# Patient Record
Sex: Male | Born: 1980 | Race: White | Hispanic: No | Marital: Married | Smoking: Current every day smoker
Health system: Southern US, Community
[De-identification: ages and names within clinical notes are randomized; demographics above are authoritative.]

## PROBLEM LIST (undated history)

## (undated) DIAGNOSIS — M1990 Unspecified osteoarthritis, unspecified site: Secondary | ICD-10-CM

## (undated) DIAGNOSIS — F419 Anxiety disorder, unspecified: Secondary | ICD-10-CM

## (undated) DIAGNOSIS — F4310 Post-traumatic stress disorder, unspecified: Secondary | ICD-10-CM

## (undated) DIAGNOSIS — K219 Gastro-esophageal reflux disease without esophagitis: Secondary | ICD-10-CM

## (undated) DIAGNOSIS — R569 Unspecified convulsions: Secondary | ICD-10-CM

## (undated) DIAGNOSIS — I10 Essential (primary) hypertension: Secondary | ICD-10-CM

## (undated) HISTORY — PX: KNEE SURGERY: SHX244

---
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
## 2001-02-08 ENCOUNTER — Emergency Department (HOSPITAL_COMMUNITY): Admission: EM | Admit: 2001-02-08 | Discharge: 2001-02-08 | Payer: Self-pay | Admitting: *Deleted

## 2010-08-17 ENCOUNTER — Emergency Department (HOSPITAL_COMMUNITY)
Admission: EM | Admit: 2010-08-17 | Discharge: 2010-08-17 | Disposition: A | Discharge status: Home / Self Care | Payer: Self-pay | Attending: Emergency Medicine | Admitting: Emergency Medicine

## 2010-08-17 DIAGNOSIS — I10 Essential (primary) hypertension: Secondary | ICD-10-CM | POA: Insufficient documentation

## 2010-08-17 DIAGNOSIS — L723 Sebaceous cyst: Secondary | ICD-10-CM | POA: Insufficient documentation

## 2010-08-17 DIAGNOSIS — F411 Generalized anxiety disorder: Secondary | ICD-10-CM | POA: Insufficient documentation

## 2010-08-20 LAB — WOUND CULTURE
Culture: NO GROWTH
Gram Stain: NONE SEEN

## 2011-06-12 ENCOUNTER — Other Ambulatory Visit: Payer: Self-pay | Admitting: Nephrology

## 2011-06-12 ENCOUNTER — Ambulatory Visit
Admission: RE | Admit: 2011-06-12 | Discharge: 2011-06-12 | Disposition: A | Discharge status: Home / Self Care | Payer: Medicaid Other | Source: Ambulatory Visit | Attending: Nephrology | Admitting: Nephrology

## 2011-06-12 DIAGNOSIS — R52 Pain, unspecified: Secondary | ICD-10-CM

## 2011-06-12 DIAGNOSIS — F17200 Nicotine dependence, unspecified, uncomplicated: Secondary | ICD-10-CM

## 2011-07-08 ENCOUNTER — Encounter (HOSPITAL_COMMUNITY): Payer: Self-pay

## 2011-07-08 ENCOUNTER — Emergency Department (HOSPITAL_COMMUNITY)
Admission: EM | Admit: 2011-07-08 | Discharge: 2011-07-08 | Disposition: A | Discharge status: Home / Self Care | Payer: Medicaid Other | Attending: Emergency Medicine | Admitting: Emergency Medicine

## 2011-07-08 ENCOUNTER — Emergency Department (HOSPITAL_COMMUNITY): Payer: Medicaid Other

## 2011-07-08 ENCOUNTER — Other Ambulatory Visit: Payer: Self-pay

## 2011-07-08 DIAGNOSIS — R569 Unspecified convulsions: Secondary | ICD-10-CM

## 2011-07-08 DIAGNOSIS — F411 Generalized anxiety disorder: Secondary | ICD-10-CM | POA: Insufficient documentation

## 2011-07-08 DIAGNOSIS — Z79899 Other long term (current) drug therapy: Secondary | ICD-10-CM | POA: Insufficient documentation

## 2011-07-08 DIAGNOSIS — R339 Retention of urine, unspecified: Secondary | ICD-10-CM | POA: Insufficient documentation

## 2011-07-08 DIAGNOSIS — R319 Hematuria, unspecified: Secondary | ICD-10-CM | POA: Insufficient documentation

## 2011-07-08 DIAGNOSIS — K219 Gastro-esophageal reflux disease without esophagitis: Secondary | ICD-10-CM | POA: Insufficient documentation

## 2011-07-08 DIAGNOSIS — F4310 Post-traumatic stress disorder, unspecified: Secondary | ICD-10-CM | POA: Insufficient documentation

## 2011-07-08 DIAGNOSIS — M129 Arthropathy, unspecified: Secondary | ICD-10-CM | POA: Insufficient documentation

## 2011-07-08 DIAGNOSIS — I10 Essential (primary) hypertension: Secondary | ICD-10-CM | POA: Insufficient documentation

## 2011-07-08 DIAGNOSIS — T50905A Adverse effect of unspecified drugs, medicaments and biological substances, initial encounter: Secondary | ICD-10-CM

## 2011-07-08 HISTORY — DX: Essential (primary) hypertension: I10

## 2011-07-08 HISTORY — DX: Unspecified osteoarthritis, unspecified site: M19.90

## 2011-07-08 HISTORY — DX: Unspecified convulsions: R56.9

## 2011-07-08 HISTORY — DX: Gastro-esophageal reflux disease without esophagitis: K21.9

## 2011-07-08 HISTORY — DX: Anxiety disorder, unspecified: F41.9

## 2011-07-08 HISTORY — DX: Post-traumatic stress disorder, unspecified: F43.10

## 2011-07-08 LAB — BASIC METABOLIC PANEL
BUN: 10 mg/dL (ref 6–23)
CO2: 35 mEq/L — ABNORMAL HIGH (ref 19–32)
Chloride: 96 mEq/L (ref 96–112)
Glucose, Bld: 137 mg/dL — ABNORMAL HIGH (ref 70–99)
Potassium: 4.1 mEq/L (ref 3.5–5.1)
Sodium: 137 mEq/L (ref 135–145)

## 2011-07-08 LAB — DIFFERENTIAL
Lymphocytes Relative: 10 % — ABNORMAL LOW (ref 12–46)
Lymphs Abs: 1.2 10*3/uL (ref 0.7–4.0)
Monocytes Relative: 9 % (ref 3–12)
Neutro Abs: 9.8 10*3/uL — ABNORMAL HIGH (ref 1.7–7.7)
Neutrophils Relative %: 80 % — ABNORMAL HIGH (ref 43–77)

## 2011-07-08 LAB — SALICYLATE LEVEL: Salicylate Lvl: <2 mg/dL — ABNORMAL LOW (ref 2.8–20.0)

## 2011-07-08 LAB — GLUCOSE, CAPILLARY: Glucose-Capillary: 115 mg/dL — ABNORMAL HIGH (ref 70–99)

## 2011-07-08 LAB — ETHANOL: Alcohol, Ethyl (B): <11 mg/dL (ref 0–11)

## 2011-07-08 LAB — CBC
Hemoglobin: 13.7 g/dL (ref 13.0–17.0)
MCH: 31.6 pg (ref 26.0–34.0)
Platelets: 196 10*3/uL (ref 150–400)
RBC: 4.34 MIL/uL (ref 4.22–5.81)
WBC: 12.3 10*3/uL — ABNORMAL HIGH (ref 4.0–10.5)

## 2011-07-08 MED ORDER — SODIUM CHLORIDE 0.9 % IV BOLUS (SEPSIS)
1000.0000 mL | Freq: Once | INTRAVENOUS | Status: AC
  Administered 2011-07-08: 1000 mL via INTRAVENOUS

## 2011-07-08 NOTE — ED Provider Notes (Signed)
History     CSN: [id number]  Arrival date & time 07/08/11  4098   First MD Initiated Contact with Patient 07/08/11 [phone]      Chief Complaint  Patient presents with  . Seizures  . Urinary Retention    (Consider location/radiation/quality/duration/timing/severity/associated sxs/prior treatment) Patient is a 31 y.o. male presenting with seizures. The history is provided by the patient, a relative and a parent.  Seizures  This is a new problem. The current episode started 1 to 2 hours ago. The problem has been resolved. There were 2 to 3 seizures. The most recent episode lasted 30 to 120 seconds. Associated symptoms include confusion and speech difficulty. Pertinent negatives include no headaches, no visual disturbance, no neck stiffness, no chest pain, no nausea, no vomiting, no diarrhea and no muscle weakness. Characteristics include rhythmic jerking and loss of consciousness. Characteristics do not include bladder incontinence or bit tongue. The episode was witnessed. The seizures did not continue in the ED. There has been no fever.  Per family pt was normal  Yesterday. Took is regular medications prior to bed time last night which was ambien, tylenol #3, seroquel. Pt then work up this morning very somnolent. Was leaning over the bath tub sedated and had 3 seizures, tonic clonic per significant other.  Pt denies any other drugs or alcohol. Admits to occasional fentanyl patches that he gets from his neigbor's, but denies using those in 2 days. Denies prior seizures. Per family pt is now appearing very sedated, slurring his speech.  Past Medical History  Diagnosis Date  . Seizures   . Hypertension   . Acid reflux   . Arthritis   . Anxiety   . PTSD (post-traumatic stress disorder)     No past surgical history on file.  No family history on file.  History  Substance Use Topics  . Smoking status: Not on file  . Smokeless tobacco: Not on file  . Alcohol Use:       Review of  Systems  Constitutional: Positive for chills.  HENT: Negative.   Eyes: Negative.  Negative for visual disturbance.  Respiratory: Negative.   Cardiovascular: Negative for chest pain, palpitations and leg swelling.  Gastrointestinal: Negative.  Negative for nausea, vomiting and diarrhea.  Genitourinary: Positive for hematuria and difficulty urinating. Negative for bladder incontinence.  Musculoskeletal: Negative.   Skin: Negative.   Neurological: Positive for seizures, loss of consciousness, speech difficulty and weakness. Negative for facial asymmetry and headaches.  Psychiatric/Behavioral: Positive for confusion and decreased concentration.    Allergies  Tramadol  Home Medications   Current Outpatient Rx  Name Route Sig Dispense Refill  . ACETAMINOPHEN-CODEINE #3 300-30 MG PO TABS Oral Take 1 tablet by mouth every 4 (four) hours as needed. pain    . DICLOFENAC SODIUM 1 % TD GEL Topical Apply 1 application topically 2 (two) times daily.    . FUROSEMIDE 40 MG PO TABS Oral Take 40 mg by mouth every other day.    . IBUPROFEN 200 MG PO TABS Oral Take 400 mg by mouth every 6 (six) hours as needed. pain    . QUETIAPINE FUMARATE 200 MG PO TABS Oral Take 200 mg by mouth at bedtime.    Marland Kitchen ZOLPIDEM TARTRATE 5 MG PO TABS Oral Take 5 mg by mouth at bedtime as needed. sleep      BP 122/73  Pulse 105  Temp(Src) 98.6 F (37 C) (Oral)  Resp 17  SpO2 96%  Physical Exam  Nursing note  and vitals reviewed. Constitutional: He is oriented to person, place, and time. He appears well-developed and well-nourished. No distress.       Appears somnlent, hard time keeping eyes open  HENT:  Head: Normocephalic and atraumatic.       Oral mucosa dry  Eyes: EOM are normal.       Pupils constricted, round, reactive  Neck: Normal range of motion. Neck supple.  Cardiovascular: Regular rhythm.  Tachycardia present.   Pulmonary/Chest: Effort normal and breath sounds normal. No respiratory distress.    Abdominal: Normal appearance and bowel sounds are normal. There is no tenderness.  Musculoskeletal: Normal range of motion. He exhibits no edema.  Neurological: He is alert and oriented to person, place, and time.       Somnolent, movements slow, normal coordination  Skin: Skin is warm and dry.  Psychiatric:       Flat affect    ED Course  Procedures (including critical care time)  9:17 AM Pt seen and examined by me. Pt is tachycardic, oral mucosa dry, pt appears sedated, pupils constricted. Worrisome for drug over dose vs anticholinergic toxicity. Labs and ECG ordered. Will do CT head for new onset seizure. Will monitor.     Date: 07/08/2011  Rate: 102  Rhythm: sinus tachycardia  QRS Axis: normal  Intervals: normal  ST/T Wave abnormalities: normal  Conduction Disutrbances:none  Narrative Interpretation:   Old EKG Reviewed: none available  Results for orders placed during the hospital encounter of 07/08/11  CBC      Component Value Range   WBC 12.3 (*) 4.0 - 10.5 (K/uL)   RBC 4.34  4.22 - 5.81 (MIL/uL)   Hemoglobin 13.7  13.0 - 17.0 (g/dL)   HCT 11.9  14.7 - 82.9 (%)   MCV 93.1  78.0 - 100.0 (fL)   MCH 31.6  26.0 - 34.0 (pg)   MCHC 33.9  30.0 - 36.0 (g/dL)   RDW 56.2  13.0 - 86.5 (%)   Platelets 196  150 - 400 (K/uL)  DIFFERENTIAL      Component Value Range   Neutrophils Relative 80 (*) 43 - 77 (%)   Neutro Abs 9.8 (*) 1.7 - 7.7 (K/uL)   Lymphocytes Relative 10 (*) 12 - 46 (%)   Lymphs Abs 1.2  0.7 - 4.0 (K/uL)   Monocytes Relative 9  3 - 12 (%)   Monocytes Absolute 1.1 (*) 0.1 - 1.0 (K/uL)   Eosinophils Relative 2  0 - 5 (%)   Eosinophils Absolute 0.2  0.0 - 0.7 (K/uL)   Basophils Relative 0  0 - 1 (%)   Basophils Absolute 0.0  0.0 - 0.1 (K/uL)  BASIC METABOLIC PANEL      Component Value Range   Sodium 137  135 - 145 (mEq/L)   Potassium 4.1  3.5 - 5.1 (mEq/L)   Chloride 96  96 - 112 (mEq/L)   CO2 35 (*) 19 - 32 (mEq/L)   Glucose, Bld 137 (*) 70 - 99 (mg/dL)    BUN 10  6 - 23 (mg/dL)   Creatinine, Ser 7.84  0.50 - 1.35 (mg/dL)   Calcium 9.5  8.4 - 69.6 (mg/dL)   GFR calc non Af Amer >90  >90 (mL/min)   GFR calc Af Amer >90  >90 (mL/min)  ACETAMINOPHEN LEVEL      Component Value Range   Acetaminophen (Tylenol), Serum <15.0  10 - 30 (ug/mL)  SALICYLATE LEVEL      Component Value Range  Salicylate Lvl <2.0 (*) 2.8 - 20.0 (mg/dL)  ETHANOL      Component Value Range   Alcohol, Ethyl (B) <11  0 - 11 (mg/dL)  GLUCOSE, CAPILLARY      Component Value Range   Glucose-Capillary 115 (*) 70 - 99 (mg/dL)   Comment 1 Documented in Chart     Comment 2 Notify RN     Dg Chest 2 Vie Ct Head Wo Contrast  07/08/2011  *RADIOLOGY REPORT*  Clinical Data: Seizure  CT HEAD WITHOUT CONTRAST  Technique:  Contiguous axial images were obtained from the base of the skull through the vertex without contrast.  Comparison: None.  Findings: Ventricles are normal in size.  Negative for intracranial hemorrhage.  Negative for mass or edema.  Negative for acute or chronic infarct.  Chronic sinusitis with mucosal edema in the paranasal sinuses.  No acute bony abnormality.  IMPRESSION: No significant intracranial abnormality.  Chronic sinusitis.  Original Report Authenticated By: Camelia Phenes, M.D.      1:18 PM Pt monitored while in ED. No arrhithmias on ECG. Negative CT heat. No significant lab abnormalities. Pt AAOx3, continues to be sedated. Pt refusing to provide urine. Wife asking for pt to be discharged with him. Will d/c home. VS normal on the monitor. Instructed to follow up closely. No diagnosis found.    MDM          Lottie Mussel, PA 07/08/11 1320

## 2011-07-08 NOTE — ED Provider Notes (Signed)
Medical screening examination/treatment/procedure(s) were performed by non-physician practitioner and as supervising physician I was immediately available for consultation/collaboration.  Javelle Donigan R Konner Saiz, MD 07/08/11 1959 

## 2011-07-08 NOTE — ED Notes (Signed)
Pt in via ems with seizure activity and painful urination  States headache and neck pain family states pt had 3 seizures in less than 5 minutes pt a/o x3 on arrival per ems non postictal state on arrival

## 2011-07-08 NOTE — ED Notes (Signed)
Pt is resting with family at bedside.  Pt still cannot provide urine for specimen.  Pt made aware that EDP ordered I&O, pt refuses.  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

## 2011-07-08 NOTE — ED Notes (Signed)
Tatyana K. EDPA  Notified re pt's refusal to have I&O cath done and his desire to go home.

## 2011-07-08 NOTE — ED Notes (Signed)
ZOX:[id number]<AV> Expected date:07/08/11<BR> Expected time: 8:02 AM<BR> Means of arrival:Ambulance<BR> Comments:<BR> seizure

## 2011-08-22 ENCOUNTER — Other Ambulatory Visit: Payer: Self-pay

## 2011-08-22 ENCOUNTER — Observation Stay (HOSPITAL_COMMUNITY)
Admission: EM | Admit: 2011-08-22 | Discharge: 2011-08-22 | Discharge status: Against Medical Advice | Payer: Medicaid Other | Attending: Internal Medicine | Admitting: Internal Medicine

## 2011-08-22 ENCOUNTER — Encounter (HOSPITAL_COMMUNITY): Payer: Self-pay | Admitting: Emergency Medicine

## 2011-08-22 ENCOUNTER — Ambulatory Visit (HOSPITAL_COMMUNITY): Payer: Medicaid Other

## 2011-08-22 DIAGNOSIS — R259 Unspecified abnormal involuntary movements: Secondary | ICD-10-CM | POA: Insufficient documentation

## 2011-08-22 DIAGNOSIS — E876 Hypokalemia: Secondary | ICD-10-CM | POA: Diagnosis present

## 2011-08-22 DIAGNOSIS — F410 Panic disorder [episodic paroxysmal anxiety] without agoraphobia: Secondary | ICD-10-CM | POA: Insufficient documentation

## 2011-08-22 DIAGNOSIS — F4310 Post-traumatic stress disorder, unspecified: Secondary | ICD-10-CM | POA: Insufficient documentation

## 2011-08-22 DIAGNOSIS — I10 Essential (primary) hypertension: Secondary | ICD-10-CM | POA: Insufficient documentation

## 2011-08-22 DIAGNOSIS — G9340 Encephalopathy, unspecified: Secondary | ICD-10-CM | POA: Diagnosis present

## 2011-08-22 DIAGNOSIS — K219 Gastro-esophageal reflux disease without esophagitis: Secondary | ICD-10-CM | POA: Diagnosis present

## 2011-08-22 DIAGNOSIS — R4182 Altered mental status, unspecified: Principal | ICD-10-CM | POA: Insufficient documentation

## 2011-08-22 DIAGNOSIS — F17200 Nicotine dependence, unspecified, uncomplicated: Secondary | ICD-10-CM | POA: Insufficient documentation

## 2011-08-22 DIAGNOSIS — R569 Unspecified convulsions: Secondary | ICD-10-CM | POA: Diagnosis present

## 2011-08-22 DIAGNOSIS — F411 Generalized anxiety disorder: Secondary | ICD-10-CM | POA: Insufficient documentation

## 2011-08-22 LAB — CBC
HCT: 38.6 % — ABNORMAL LOW (ref 39.0–52.0)
Hemoglobin: 15.3 g/dL (ref 13.0–17.0)
Hemoglobin: 13.4 g/dL (ref 13.0–17.0)
MCH: 31.7 pg (ref 26.0–34.0)
MCV: 89.8 fL (ref 78.0–100.0)
MCV: 90.4 fL (ref 78.0–100.0)
Platelets: 217 10*3/uL (ref 150–400)
RBC: 4.82 MIL/uL (ref 4.22–5.81)
RDW: 12.8 % (ref 11.5–15.5)
WBC: 7.8 10*3/uL (ref 4.0–10.5)
WBC: 10.8 10*3/uL — ABNORMAL HIGH (ref 4.0–10.5)

## 2011-08-22 LAB — COMPREHENSIVE METABOLIC PANEL
ALT: 18 U/L (ref 0–53)
ALT: 15 U/L (ref 0–53)
Albumin: 3.5 g/dL (ref 3.5–5.2)
Alkaline Phosphatase: 70 U/L (ref 39–117)
Alkaline Phosphatase: 65 U/L (ref 39–117)
BUN: 15 mg/dL (ref 6–23)
BUN: 17 mg/dL (ref 6–23)
CO2: 22 mEq/L (ref 19–32)
Calcium: 9.3 mg/dL (ref 8.4–10.5)
Calcium: 8.7 mg/dL (ref 8.4–10.5)
GFR calc Af Amer: >90 mL/min (ref >90)
GFR calc non Af Amer: >90 mL/min (ref >90)
Glucose, Bld: 105 mg/dL — ABNORMAL HIGH (ref 70–99)
Potassium: 2.9 mEq/L — ABNORMAL LOW (ref 3.5–5.1)
Potassium: 3.6 mEq/L (ref 3.5–5.1)
Sodium: 140 mEq/L (ref 135–145)
Sodium: 140 mEq/L (ref 135–145)
Total Protein: 6.6 g/dL (ref 6.0–8.3)
Total Protein: 6 g/dL (ref 6.0–8.3)

## 2011-08-22 LAB — DIFFERENTIAL
Eosinophils Absolute: 0.1 10*3/uL (ref 0.0–0.7)
Eosinophils Relative: 2 % (ref 0–5)
Lymphocytes Relative: 41 % (ref 12–46)
Lymphs Abs: 3.2 10*3/uL (ref 0.7–4.0)
Monocytes Relative: 6 % (ref 3–12)

## 2011-08-22 LAB — APTT: aPTT: 27 seconds (ref 24–37)

## 2011-08-22 LAB — ACETAMINOPHEN LEVEL: Acetaminophen (Tylenol), Serum: <15 ug/mL (ref 10–30)

## 2011-08-22 LAB — CK: Total CK: 50 U/L (ref 7–232)

## 2011-08-22 MED ORDER — POTASSIUM CHLORIDE IN NACL 40-0.9 MEQ/L-% IV SOLN
INTRAVENOUS | Status: DC
  Administered 2011-08-22: 05:00:00 via INTRAVENOUS
  Filled 2011-08-22 (×3): qty 1000

## 2011-08-22 MED ORDER — LORAZEPAM 2 MG/ML IJ SOLN
INTRAMUSCULAR | Status: AC
  Filled 2011-08-22: qty 1

## 2011-08-22 MED ORDER — ONDANSETRON HCL 4 MG PO TABS: 4.0000 mg | ORAL_TABLET | Freq: Four times a day (QID) | ORAL | Status: DC | PRN

## 2011-08-22 MED ORDER — LORAZEPAM 2 MG/ML IJ SOLN
2.0000 mg | Freq: Once | INTRAMUSCULAR | Status: AC
  Administered 2011-08-22: 2 mg via INTRAVENOUS
  Filled 2011-08-22: qty 1

## 2011-08-22 MED ORDER — LORAZEPAM 2 MG/ML IJ SOLN
2.0000 mg | Freq: Once | INTRAMUSCULAR | Status: AC
  Administered 2011-08-22: 2 mg via INTRAVENOUS

## 2011-08-22 MED ORDER — ONDANSETRON HCL 4 MG/2ML IJ SOLN: 4.0000 mg | Freq: Four times a day (QID) | INTRAMUSCULAR | Status: DC | PRN

## 2011-08-22 MED ORDER — SODIUM CHLORIDE 0.9 % IJ SOLN: 3.0000 mL | Freq: Two times a day (BID) | INTRAMUSCULAR | Status: DC

## 2011-08-22 MED ORDER — LORAZEPAM 2 MG/ML IJ SOLN: 2.0000 mg | Freq: Once | INTRAMUSCULAR | Status: DC

## 2011-08-22 NOTE — ED Notes (Signed)
Patient with tremors and possible seizure like activity tonight, EMS found patient with tremors and CAOx3, able to walk and talk, no urinary incontinence.  Patient's family states today is anniversary for death of friend.  Patient has history of PTSD.

## 2011-08-22 NOTE — Progress Notes (Signed)
Pt wanted to leave AMA. MD aware. Pt signed AMA form. IV and tele Dc'd. Pt and belongings escorted to exit of facility. [place], Rosanna Randy

## 2011-08-22 NOTE — Progress Notes (Signed)
Pt arrived to the floor via stretcher, accompanied by Nursing staff. Admission hx and assessment completed. Pt had no complaints of pain or shortness of breath. Will continue to assess. Bed in lowest position, wheels locked, and call bell within reach. 

## 2011-08-22 NOTE — ED Notes (Signed)
Patient here with garbled speech, red skin, dry mucus membranes.  Patient with tremors, has had before.  Patient's family states that he has "seizures", but has not been diagnosed or take meds for seizures.  Patient denies any drug use.

## 2011-08-22 NOTE — Discharge Summary (Signed)
Patient ID: Arthur Baird MRN: [id number] DOB/AGE: [birth year] 31 y.o.  Admit date: 08/22/2011 Discharge date: 08/22/2011  Primary Care Physician:  Jarome Matin, MD, MD  Discharge Diagnoses:     Principal Problem:  *Panic anxiety syndrome Active Problems:  Encephalopathy  Hypokalemia  GERD (gastroesophageal reflux disease)  Seizure   Medication List  As of 08/22/2011  5:53 PM   ASK your doctor about these medications         acetaminophen-codeine 300-30 MG per tablet   Commonly known as: TYLENOL #3   Take 1 tablet by mouth every 4 (four) hours as needed. pain      carisoprodol 350 MG tablet   Commonly known as: SOMA   Take 350 mg by mouth 4 (four) times daily as needed. For spasms      diclofenac sodium 1 % Gel   Commonly known as: VOLTAREN   Apply 1 application topically 2 (two) times daily.      furosemide 40 MG tablet   Commonly known as: LASIX   Take 40 mg by mouth every other day.      ibuprofen 200 MG tablet   Commonly known as: ADVIL,MOTRIN   Take 400 mg by mouth every 6 (six) hours as needed. pain      QUEtiapine 200 MG tablet   Commonly known as: SEROQUEL   Take 200 mg by mouth at bedtime.      zolpidem 5 MG tablet   Commonly known as: AMBIEN   Take 5 mg by mouth at bedtime as needed. sleep            Disposition and Follow-up FOLLOW UP WITH PCP  Consults:   NONE  Significant Diagnostic Studies:  No results found.  Brief H and P: For complete details please refer to admission H and P, but in brief 31 yo man brought in by family after found unresponsive at home. He had a similar episode before presumed due to seizure but never had a work up. He was said to have had another seizure by family but never observed. Patient has history of PTSD and on seroquel among other things. He left the ER AMA last time refusing to give urine for tox screen.   Physical Exam on Discharge:  Filed Vitals:   08/22/11 0401 08/22/11 0435 08/22/11 0450  08/22/11 0844  BP: 111/64  112/71 112/66  Pulse: 100  105 92  Temp:   98 F (36.7 C) 98.2 F (36.8 C)  TempSrc:   Oral Oral  Resp: 15  20 17   Height:  5\' 11"  (1.803 m)    Weight:   61.8 kg (136 lb 3.9 oz)   SpO2: 98%  99% 99%     Intake/Output Summary (Last 24 hours) at 08/22/11 1753 Last data filed at 08/22/11 0846  Gross per 24 hour  Intake   2200 ml  Output      0 ml  Net   2200 ml    General: Alert, awake, oriented x3, in no acute distress. HEENT: No bruits, no goiter. Heart: Regular rate and rhythm, without murmurs, rubs, gallops. Lungs: Clear to auscultation bilaterally. Abdomen: Soft, nontender, nondistended, positive bowel sounds. Extremities: No clubbing cyanosis or edema with positive pedal pulses. Neuro: Grossly intact, nonfocal.  CBC:    Component Value Date/Time   WBC 10.8* 08/22/2011 0525   HGB 13.4 08/22/2011 0525   HCT 38.6* 08/22/2011 0525   PLT 215 08/22/2011 0525   MCV 90.4 08/22/2011 0525  NEUTROABS 3.9 08/22/2011 0043   LYMPHSABS 3.2 08/22/2011 0043   MONOABS 0.5 08/22/2011 0043   EOSABS 0.1 08/22/2011 0043   BASOSABS 0.0 08/22/2011 0043    Basic Metabolic Panel:    Component Value Date/Time   NA 140 08/22/2011 0525   K 3.6 08/22/2011 0525   CL 108 08/22/2011 0525   CO2 24 08/22/2011 0525   BUN 17 08/22/2011 0525   CREATININE 0.89 08/22/2011 0525   GLUCOSE 77 08/22/2011 0525   CALCIUM 8.7 08/22/2011 0525    Hospital Course:  Patient admitted with concern for AMS and ? Seizures. He was incoherent, sleepy and anxious on  admission. During my evaluation this am he was awake and fully oriented and says he was sleepy due to soma that he took yesterday. He says he remembers everything that happended but did not resists coming to hospital as he was too sleepy. Spoke with his wife at length who tells ne that he is combative at times, very suspicious and panics with small issues. On question patient says he has issues with panicking with stressors in life and that's likely what  happened yesterday. He has issues with poor urine output since past 2 yrs and is seeing a urologist. i requested him to stay back, get a utox and further work up including a psych eval but he was quite agitated and wanted to leave right away. patient clearly oriented and capable to make decisions for himself and signed himself out. He was informed the health risks associated with signing out and he understood very clearly.   Time spent on Discharge: 45 mins  Signed: Eddie North 08/22/2011, 5:53 PM

## 2011-08-22 NOTE — ED Provider Notes (Signed)
History     CSN: [id number]  Arrival date & time 08/22/11  0019   First MD Initiated Contact with Patient 08/22/11 0030      Chief Complaint  Patient presents with  . Tremors  . Seizures    (Consider location/radiation/quality/duration/timing/severity/associated sxs/prior treatment) HPI Comments: 31 year old male with a history of hypertension, anxiety, posttraumatic stress disorder, possible seizures who presents with a complaint of altered mental status. According to the paramedics the patient was found walking around his house, slurring his speech, severe tremor, unsteady gait and confused. Review of the medical records shows that the patient had a similar presentation in the last 2 months he was found to be altered, tachycardic, seizure-like activity and slurred speech. At that time his CT head was normal, blood work was nonfocal and patient finally requested to be discharged. The patient is unable to tell me when the symptoms started, what he was doing when it started or what the nature of his symptoms initially were. Family is not here at this time  The history is provided by the patient, the EMS personnel and medical records. The history is limited by the condition of the patient (Altered mental status).    Past Medical History  Diagnosis Date  . Seizures   . Hypertension   . Acid reflux   . Arthritis   . Anxiety   . PTSD (post-traumatic stress disorder)     History reviewed. No pertinent past surgical history.  History reviewed. No pertinent family history.  History  Substance Use Topics  . Smoking status: Not on file  . Smokeless tobacco: Not on file  . Alcohol Use:       Review of Systems  Unable to perform ROS: Mental status change    Allergies  Tramadol  Home Medications   Current Outpatient Rx  Name Route Sig Dispense Refill  . ACETAMINOPHEN-CODEINE #3 300-30 MG PO TABS Oral Take 1 tablet by mouth every 4 (four) hours as needed. pain    .  CARISOPRODOL 350 MG PO TABS Oral Take 350 mg by mouth 4 (four) times daily as needed. For spasms    . DICLOFENAC SODIUM 1 % TD GEL Topical Apply 1 application topically 2 (two) times daily.    . FUROSEMIDE 40 MG PO TABS Oral Take 40 mg by mouth every other day.    . IBUPROFEN 200 MG PO TABS Oral Take 400 mg by mouth every 6 (six) hours as needed. pain    . QUETIAPINE FUMARATE 200 MG PO TABS Oral Take 200 mg by mouth at bedtime.    Marland Kitchen ZOLPIDEM TARTRATE 5 MG PO TABS Oral Take 5 mg by mouth at bedtime as needed. sleep      BP 92/59  Pulse 116  Temp(Src) 98.3 F (36.8 C) (Rectal)  Resp 21  SpO2 95%  Physical Exam  Nursing note and vitals reviewed. Constitutional: He appears well-developed and well-nourished.       Tremulous, mildly somnolent, anxious appearing  HENT:  Head: Normocephalic and atraumatic.  Mouth/Throat: No oropharyngeal exudate.       Mucous membranes dry  Eyes: Conjunctivae and EOM are normal. Pupils are equal, round, and reactive to light. Right eye exhibits no discharge. Left eye exhibits no discharge. No scleral icterus.  Neck: Normal range of motion. Neck supple. No JVD present. No thyromegaly present.  Cardiovascular: Regular rhythm, normal heart sounds and intact distal pulses.  Exam reveals no gallop and no friction rub.   No murmur heard.  Tachycardia to 140  Pulmonary/Chest: Effort normal and breath sounds normal. No respiratory distress. He has no wheezes. He has no rales.  Abdominal: Soft. Bowel sounds are normal. He exhibits no distension and no mass. There is no tenderness.  Musculoskeletal: Normal range of motion. He exhibits no edema and no tenderness.  Lymphadenopathy:    He has no cervical adenopathy.  Neurological:       Diffuse tremor of both arms and both legs, strength 5 out of 5 in all 4 extremities, speech is slurred intermittently, stuttering speech, some memory loss 2 today  Skin: Skin is warm and dry. No rash noted. No erythema.    Psychiatric: He has a normal mood and affect. His behavior is normal.    ED Course  Procedures (including critical care time)  ED ECG REPORT   Date: 08/22/2011   Rate: 135  Rhythm: sinus tachycardia  QRS Axis: normal  Intervals: normal  ST/T Wave abnormalities: normal  Conduction Disutrbances:none  Narrative Interpretation:   Old EKG Reviewed: unchanged other than rate from last tracing   Labs Reviewed  COMPREHENSIVE METABOLIC PANEL - Abnormal; Notable for the following:    Potassium 2.9 (*)    Glucose, Bld 105 (*)    All other components within normal limits  LACTIC ACID, PLASMA - Abnormal; Notable for the following:    Lactic Acid, Venous 4.4 (*)    All other components within normal limits  ETHANOL - Abnormal; Notable for the following:    Alcohol, Ethyl (B) 17 (*)    All other components within normal limits  SALICYLATE LEVEL - Abnormal; Notable for the following:    Salicylate Lvl <2.0 (*)    All other components within normal limits  CBC  DIFFERENTIAL  APTT  PROTIME-INR  ACETAMINOPHEN LEVEL  CK  URINALYSIS, ROUTINE W REFLEX MICROSCOPIC  URINE RAPID DRUG SCREEN (HOSP PERFORMED)   No results found.   1. Altered mental status       MDM  Patient has an anticholinergic type presentation, he is known to be on Seroquel, Ambien, Lasix, soma and Tylenol 3. He also admits to prior visits to taking adverse medication including fentanyl patch. At this time we'll proceed with Ativan, fluids, supportive care, further evaluation. There is no indication to repeat CAT scan of the head at this time as he had a recent CAT scan that was normal. Blood work, urinalysis, drug screen, cardiac monitoring.  Laboratory results reviewed showing hypokalemia at 2.9, lactic acid of 4.4, CK of 50, CBC is normal, tachycardia has improved but speech remains slightly slurred. Patient states that he cannot give [place] a urine sample, he pees inconsistently but often only twice today.  2:30 AM,  care discussed with the [facility] hospitalist Dr. Mikeal Hawthorne who will admit the patient to the hospital.      Vida Roller, MD 08/22/11 [phone]

## 2011-08-22 NOTE — ED Notes (Signed)
Patient found taking out IV, taking EKG leads off, patient wanting to leave from ED.  Patient was talked down and placed back on monitor.  VS taken.

## 2011-08-22 NOTE — H&P (Signed)
Arthur Baird is an 31 y.o. male.   Chief Complaint: Altered mental status HPI: 31 yo man brought in by family after found unresponsive at home. He had a similar episode before presumed due to seizure but never had a work up. He was said to have had another seizure by family but never observed. Patient has history of PTSD and on seroquel among other things. He left the ER AMA last time refusing to give urine for tox screen. He is refusing to give urine again.  Past Medical History  Diagnosis Date  . Seizures   . Hypertension   . Acid reflux   . Arthritis   . Anxiety   . PTSD (post-traumatic stress disorder)     History reviewed. No pertinent past surgical history.  History reviewed. No pertinent family history. Social History:  reports that he has been smoking Cigarettes.  He has a 32 pack-year smoking history. He quit smokeless tobacco use about 8 weeks ago. His smokeless tobacco use included Chew. He reports that he drinks about 1.2 ounces of alcohol per week. He reports that he does not use illicit drugs.  Allergies:  Allergies  Allergen Reactions  . Tramadol Other (See Comments)    Cant pee    Medications Prior to Admission  Medication Dose Route Frequency Provider Last Rate Last Dose  . 0.9 % NaCl with KCl 40 mEq / L  infusion   Intravenous Continuous Lonia Blood, MD 100 mL/hr at 08/22/11 0520    . LORazepam (ATIVAN) injection 2 mg  2 mg Intravenous Once Vida Roller, MD   2 mg at 08/22/11 0042  . LORazepam (ATIVAN) injection 2 mg  2 mg Intravenous Once Vida Roller, MD   2 mg at 08/22/11 0334  . ondansetron (ZOFRAN) tablet 4 mg  4 mg Oral Q6H PRN Lonia Blood, MD       Or  . ondansetron (ZOFRAN) injection 4 mg  4 mg Intravenous Q6H PRN Lonia Blood, MD      . sodium chloride 0.9 % injection 3 mL  3 mL Intravenous Q12H Lonia Blood, MD      . DISCONTD: LORazepam (ATIVAN) injection 2 mg  2 mg Intravenous Once Vida Roller, MD       Medications Prior to Admission    Medication Sig Dispense Refill  . acetaminophen-codeine (TYLENOL #3) 300-30 MG per tablet Take 1 tablet by mouth every 4 (four) hours as needed. pain      . diclofenac sodium (VOLTAREN) 1 % GEL Apply 1 application topically 2 (two) times daily.      . furosemide (LASIX) 40 MG tablet Take 40 mg by mouth every other day.      . ibuprofen (ADVIL,MOTRIN) 200 MG tablet Take 400 mg by mouth every 6 (six) hours as needed. pain      . QUEtiapine (SEROQUEL) 200 MG tablet Take 200 mg by mouth at bedtime.      Marland Kitchen zolpidem (AMBIEN) 5 MG tablet Take 5 mg by mouth at bedtime as needed. sleep        Results for orders placed during the hospital encounter of 08/22/11 (from the past 48 hour(s))  CBC     Status: Normal   Collection Time   08/22/11 12:43 AM      Component Value Range Comment   WBC 7.8  4.0 - 10.5 (K/uL)    RBC 4.82  4.22 - 5.81 (MIL/uL)    Hemoglobin 15.3  13.0 - 17.0 (g/dL)  HCT 43.3  39.0 - 52.0 (%)    MCV 89.8  78.0 - 100.0 (fL)    MCH 31.7  26.0 - 34.0 (pg)    MCHC 35.3  30.0 - 36.0 (g/dL)    RDW 16.1  09.6 - 04.5 (%)    Platelets 217  150 - 400 (K/uL)   DIFFERENTIAL     Status: Normal   Collection Time   08/22/11 12:43 AM      Component Value Range Comment   Neutrophils Relative 50  43 - 77 (%)    Neutro Abs 3.9  1.7 - 7.7 (K/uL)    Lymphocytes Relative 41  12 - 46 (%)    Lymphs Abs 3.2  0.7 - 4.0 (K/uL)    Monocytes Relative 6  3 - 12 (%)    Monocytes Absolute 0.5  0.1 - 1.0 (K/uL)    Eosinophils Relative 2  0 - 5 (%)    Eosinophils Absolute 0.1  0.0 - 0.7 (K/uL)    Basophils Relative 1  0 - 1 (%)    Basophils Absolute 0.0  0.0 - 0.1 (K/uL)   COMPREHENSIVE METABOLIC PANEL     Status: Abnormal   Collection Time   08/22/11 12:43 AM      Component Value Range Comment   Sodium 140  135 - 145 (mEq/L)    Potassium 2.9 (*) 3.5 - 5.1 (mEq/L)    Chloride 102  96 - 112 (mEq/L)    CO2 22  19 - 32 (mEq/L)    Glucose, Bld 105 (*) 70 - 99 (mg/dL)    BUN 15  6 - 23 (mg/dL)     Creatinine, Ser 4.09  0.50 - 1.35 (mg/dL)    Calcium 9.3  8.4 - 10.5 (mg/dL)    Total Protein 6.6  6.0 - 8.3 (g/dL)    Albumin 3.9  3.5 - 5.2 (g/dL)    AST 15  0 - 37 (U/L)    ALT 18  0 - 53 (U/L)    Alkaline Phosphatase 70  39 - 117 (U/L)    Total Bilirubin 0.3  0.3 - 1.2 (mg/dL)    GFR calc non Af Amer >90  >90 (mL/min)    GFR calc Af Amer >90  >90 (mL/min)   APTT     Status: Normal   Collection Time   08/22/11 12:43 AM      Component Value Range Comment   aPTT 27  24 - 37 (seconds)   PROTIME-INR     Status: Normal   Collection Time   08/22/11 12:43 AM      Component Value Range Comment   Prothrombin Time 14.2  11.6 - 15.2 (seconds)    INR 1.08  0.00 - 1.49    ETHANOL     Status: Abnormal   Collection Time   08/22/11 12:43 AM      Component Value Range Comment   Alcohol, Ethyl (B) 17 (*) 0 - 11 (mg/dL)   SALICYLATE LEVEL     Status: Abnormal   Collection Time   08/22/11 12:43 AM      Component Value Range Comment   Salicylate Lvl <2.0 (*) 2.8 - 20.0 (mg/dL)   ACETAMINOPHEN LEVEL     Status: Normal   Collection Time   08/22/11 12:43 AM      Component Value Range Comment   Acetaminophen (Tylenol), Serum <15.0  10 - 30 (ug/mL)   CK     Status: Normal   Collection  Time   08/22/11 12:43 AM      Component Value Range Comment   Total CK 50  7 - 232 (U/L)   LACTIC ACID, PLASMA     Status: Abnormal   Collection Time   08/22/11 12:44 AM      Component Value Range Comment   Lactic Acid, Venous 4.4 (*) 0.5 - 2.2 (mmol/L)    No results found.  Review of Systems  Unable to perform ROS   Blood pressure 112/71, pulse 105, temperature 98 F (36.7 C), temperature source Oral, resp. rate 20, height 5\' 11"  (1.803 m), weight 61.8 kg (136 lb 3.9 oz), SpO2 99.00%. Physical Exam  Constitutional: He appears well-developed and well-nourished. He appears lethargic.  HENT:  Head: Normocephalic and atraumatic.  Right Ear: External ear normal.  Left Ear: External ear normal.  Nose: Nose normal.    Mouth/Throat: Oropharynx is clear and moist.  Eyes: Conjunctivae and EOM are normal. Pupils are equal, round, and reactive to light.  Neck: Normal range of motion. Neck supple.  Cardiovascular: Normal rate, regular rhythm, normal heart sounds and intact distal pulses.   Respiratory: Effort normal and breath sounds normal.  GI: Soft. Bowel sounds are normal.  Neurological: He appears lethargic.  Skin: Skin is warm and dry.  Psychiatric: His mood appears anxious.     Assessment/Plan 1. Encephalopathy: More than likely due to Drugs which could be prescription vs street drugs. His alcohol level is low so it is not due to intoxication most likely. Admit for observation, obtain urine drug screen, rule out seizure if possible, get EEG. Depending on results, plan next line of action. 2. GERD: PPI 3. Hypokalemia: replete his potassium   Jeromey Kruer,LAWAL 08/22/2011, 6:47 AM

## 2011-08-22 NOTE — Progress Notes (Signed)
Utilization review completed.  

## 2011-08-22 NOTE — ED Notes (Signed)
Pt given urinal, advised we needed urine for a sample, and advised staff he didn't need to void. Rechecked pt. He was resting, and advised again that we needed a UA sample. Wife at bedside stated " pt. Only voids twice a day, and that he has already voided twice a day and will not void again until morning."

## 2011-09-03 ENCOUNTER — Encounter (HOSPITAL_COMMUNITY): Payer: Self-pay | Admitting: *Deleted

## 2011-09-03 ENCOUNTER — Emergency Department (HOSPITAL_COMMUNITY)
Admission: EM | Admit: 2011-09-03 | Discharge: 2011-09-03 | Disposition: A | Discharge status: Home / Self Care | Payer: Medicaid Other | Attending: Emergency Medicine | Admitting: Emergency Medicine

## 2011-09-03 DIAGNOSIS — M129 Arthropathy, unspecified: Secondary | ICD-10-CM | POA: Insufficient documentation

## 2011-09-03 DIAGNOSIS — R339 Retention of urine, unspecified: Secondary | ICD-10-CM | POA: Insufficient documentation

## 2011-09-03 DIAGNOSIS — I10 Essential (primary) hypertension: Secondary | ICD-10-CM | POA: Insufficient documentation

## 2011-09-03 DIAGNOSIS — F411 Generalized anxiety disorder: Secondary | ICD-10-CM | POA: Insufficient documentation

## 2011-09-03 DIAGNOSIS — F17200 Nicotine dependence, unspecified, uncomplicated: Secondary | ICD-10-CM | POA: Insufficient documentation

## 2011-09-03 DIAGNOSIS — F419 Anxiety disorder, unspecified: Secondary | ICD-10-CM

## 2011-09-03 DIAGNOSIS — Z79899 Other long term (current) drug therapy: Secondary | ICD-10-CM | POA: Insufficient documentation

## 2011-09-03 DIAGNOSIS — K219 Gastro-esophageal reflux disease without esophagitis: Secondary | ICD-10-CM | POA: Insufficient documentation

## 2011-09-03 LAB — URINALYSIS, ROUTINE W REFLEX MICROSCOPIC
Bilirubin Urine: NEGATIVE
Glucose, UA: NEGATIVE mg/dL
Hgb urine dipstick: NEGATIVE
Specific Gravity, Urine: 1.013 (ref 1.005–1.030)
Urobilinogen, UA: 0.2 mg/dL (ref 0.0–1.0)

## 2011-09-03 NOTE — ED Notes (Signed)
Dr Alison Stalling called stating that he is aware that the patient is here and would like for EDPs to check him out. States that the patient has anxiety and possibly some bipolar issues.

## 2011-09-03 NOTE — ED Notes (Signed)
Per EMS, pt reports urinary retention x 3 days.  Reports that he needed to provide a urine specimen for his parole officer today.  Pt reports doing acid and noticed blood in his urine.  Denies any pain

## 2011-09-03 NOTE — ED Provider Notes (Signed)
History     CSN: [id number]  Arrival date & time 09/03/11  1006   First MD Initiated Contact with Patient 09/03/11 1558      Chief Complaint  Patient presents with  . Anxiety  . Urinary Retention    (Consider location/radiation/quality/duration/timing/severity/associated sxs/prior treatment) Patient is a 31 y.o. male presenting with anxiety. The history is provided by the patient.  Anxiety This is a recurrent problem. Pertinent negatives include no chest pain, no abdominal pain, no headaches and no shortness of breath.   patient states that he was meeting with his parole officer. He states he was having a disagreement with them over some paperwork and began to feel anxious. He has a history of anxiety. He states that he is this started a ball up like he was going to have seizure. He states he has a history of seizures. He feels much better.Last time it was likely a soma overdose.no chest pain. Patient states she's also had issues with urinary retention for a couple years. He states he's continued to have some issues with it. He states he's been on Xanax but ran out around 5 days ago.   Past Medical History  Diagnosis Date  . Seizures   . Hypertension   . Acid reflux   . Arthritis   . Anxiety   . PTSD (post-traumatic stress disorder)     History reviewed. No pertinent past surgical history.  No family history on file.  History  Substance Use Topics  . Smoking status: Current Everyday Smoker -- 2.0 packs/day for 16 years    Types: Cigarettes  . Smokeless tobacco: Former Neurosurgeon    Types: Chew    Quit date: 06/24/2011  . Alcohol Use: 1.2 oz/week    2 Cans of beer per week      Review of Systems  Constitutional: Negative for activity change and appetite change.  HENT: Negative for neck stiffness.   Eyes: Negative for pain.  Respiratory: Negative for chest tightness and shortness of breath.   Cardiovascular: Negative for chest pain and leg swelling.  Gastrointestinal:  Negative for nausea, vomiting, abdominal pain and diarrhea.  Genitourinary: Negative for flank pain.  Musculoskeletal: Negative for back pain.  Skin: Negative for rash.  Neurological: Negative for syncope, weakness, numbness and headaches.  Psychiatric/Behavioral: Negative for behavioral problems.       Anxiety    Allergies  Tramadol  Home Medications   Current Outpatient Rx  Name Route Sig Dispense Refill  . ACETAMINOPHEN-CODEINE #3 300-30 MG PO TABS Oral Take 1 tablet by mouth every 4 (four) hours as needed. pain    . CARISOPRODOL 350 MG PO TABS Oral Take 350 mg by mouth 4 (four) times daily as needed. For spasms    . DICLOFENAC SODIUM 1 % TD GEL Topical Apply 1 application topically 2 (two) times daily. Apply to knees    . DIPHENHYDRAMINE HCL 25 MG PO CAPS Oral Take 25 mg by mouth every 6 (six) hours as needed.    . FUROSEMIDE 40 MG PO TABS Oral Take 40 mg by mouth every other day.    . QUETIAPINE FUMARATE 200 MG PO TABS Oral Take 200 mg by mouth at bedtime.    Marland Kitchen ZOLPIDEM TARTRATE 5 MG PO TABS Oral Take 5 mg by mouth at bedtime as needed. sleep      BP 123/79  Pulse 71  Temp(Src) 98.7 F (37.1 C) (Oral)  Resp 18  SpO2 98%  Physical Exam  Nursing note and  vitals reviewed. Constitutional: He is oriented to person, place, and time. He appears well-developed and well-nourished.  HENT:  Head: Normocephalic and atraumatic.  Eyes: EOM are normal. Pupils are equal, round, and reactive to light.  Neck: Normal range of motion. Neck supple.  Cardiovascular: Normal rate, regular rhythm and normal heart sounds.   No murmur heard. Pulmonary/Chest: Effort normal and breath sounds normal.  Abdominal: Soft. Bowel sounds are normal. He exhibits no distension and no mass. There is no tenderness. There is no rebound and no guarding.  Musculoskeletal: Normal range of motion. He exhibits no edema.  Neurological: He is alert and oriented to person, place, and time. No cranial nerve deficit.    Skin: Skin is warm and dry.  Psychiatric: He has a normal mood and affect.    ED Course  Procedures (including critical care time)   Labs Reviewed  URINALYSIS, ROUTINE W REFLEX MICROSCOPIC   No results found.   1. Anxiety       MDM  Patient reports that he felt like he may be would have a seizure. He states he was agitated at the time. He feels much better now. Urinalysis is normal. Patient feels much better and would like to go home. Review of previous records shows the thought was that he overdosed on soma last time. A mild hypokalemia at that time of2.9. Patient does not want to recheck that level now. He'll be discharged home in his employment to followup on Friday with her primary care Dr.        Juliet Rude. Rubin Payor, MD 09/03/11 [phone]

## 2011-09-03 NOTE — Discharge Instructions (Signed)
  Anxiety and Panic Attacks  Collection Time  Color, Urine  APPearance  Specific Gravity, Urine  pH  GLUCOSE U  Hgb urine dipstick  BILI UR  Ketones, ur  Protein, ur  Urobilinogen, UA   09/03/11 1136  YELLOW  CLEAR  1.013  5.5  NEGATIVE  NEGATIVE  NEGATIVE  NEGATIVE  NEGATIVE  0.2         Collection Time  Nitrite  LEUKOCYTES    09/03/11 1136  NEGATIVE  NEGATIVE MICROSCOPIC NOT DONE ON URINES WITH NEGATIVE PROTEIN, BLOOD, LEUKOCYTES, NITRITE, OR GLUCOSE <1000 mg/dL.         Your caregiver has informed you that you are having an anxiety or panic attack. There may be many forms of this. Most of the time these attacks come suddenly and without warning. They come at any time of day, including periods of sleep, and at any time of life. They may be strong and unexplained. Although panic attacks are very scary, they are physically harmless. Sometimes the cause of your anxiety is not known. Anxiety is a protective mechanism of the body in its fight or flight mechanism. Most of these perceived danger situations are actually nonphysical situations (such as anxiety over losing a job). CAUSES  The causes of an anxiety or panic attack are many. Panic attacks may occur in otherwise healthy people given a certain set of circumstances. There may be a genetic cause for panic attacks. Some medications may also have anxiety as a side effect. SYMPTOMS  Some of the most common feelings are:  Intense terror.   Dizziness, feeling faint.   Hot and cold flashes.   Fear of going crazy.   Feelings that nothing is real.   Sweating.   Shaking.   Chest pain or a fast heartbeat (palpitations).   Smothering, choking sensations.   Feelings of impending doom and that death is near.   Tingling of extremities, this may be from over-breathing.   Altered reality (derealization).   Being detached from yourself (depersonalization).  Several symptoms can be present to make up anxiety or panic attacks. DIAGNOSIS    The evaluation by your caregiver will depend on the type of symptoms you are experiencing. The diagnosis of anxiety or panic attack is made when no physical illness can be determined to be a cause of the symptoms. TREATMENT  Treatment to prevent anxiety and panic attacks may include:  Avoidance of circumstances that cause anxiety.   Reassurance and relaxation.   Regular exercise.   Relaxation therapies, such as yoga.   Psychotherapy with a psychiatrist or therapist.   Avoidance of caffeine, alcohol and illegal drugs.   Prescribed medication.  SEEK IMMEDIATE MEDICAL CARE IF:   You experience panic attack symptoms that are different than your usual symptoms.   You have any worsening or concerning symptoms.  Document Released: 06/09/2005 Document Revised: 05/29/2011 Document Reviewed: 10/11/2009 [facility] Patient Information 2012 [place], [place].

## 2011-09-03 NOTE — ED Notes (Signed)
Patient outside with SO smoking

## 2011-09-03 NOTE — ED Notes (Signed)
Per Lambert Mody RN-noted pt walking in the parking lot.  A few minutes later, reports that pt went out there to smoke a cigarette.

## 2011-09-03 NOTE — ED Notes (Signed)
Dr Pickering at bedside 

## 2011-09-03 NOTE — ED Notes (Signed)
Pt reports that he had a meeting with his probation officer this morning and states it did not go well and he became very anxious. States "I've been told I have seizures when I get worked up."  Pt is A/O x4. Skin warm and dry. Respirations even and unlabored. NAD noted at this time.

## 2011-09-18 ENCOUNTER — Other Ambulatory Visit: Payer: Self-pay

## 2011-09-18 ENCOUNTER — Emergency Department (HOSPITAL_COMMUNITY)
Admission: EM | Admit: 2011-09-18 | Discharge: 2011-09-18 | Disposition: A | Discharge status: Home / Self Care | Payer: Medicaid Other | Attending: Emergency Medicine | Admitting: Emergency Medicine

## 2011-09-18 ENCOUNTER — Encounter (HOSPITAL_COMMUNITY): Payer: Self-pay | Admitting: Neurology

## 2011-09-18 ENCOUNTER — Emergency Department (HOSPITAL_COMMUNITY): Payer: Medicaid Other

## 2011-09-18 DIAGNOSIS — Z8739 Personal history of other diseases of the musculoskeletal system and connective tissue: Secondary | ICD-10-CM | POA: Insufficient documentation

## 2011-09-18 DIAGNOSIS — K219 Gastro-esophageal reflux disease without esophagitis: Secondary | ICD-10-CM | POA: Insufficient documentation

## 2011-09-18 DIAGNOSIS — R569 Unspecified convulsions: Secondary | ICD-10-CM

## 2011-09-18 DIAGNOSIS — Z79899 Other long term (current) drug therapy: Secondary | ICD-10-CM | POA: Insufficient documentation

## 2011-09-18 DIAGNOSIS — R000 Tachycardia, unspecified: Secondary | ICD-10-CM | POA: Insufficient documentation

## 2011-09-18 DIAGNOSIS — I10 Essential (primary) hypertension: Secondary | ICD-10-CM | POA: Insufficient documentation

## 2011-09-18 LAB — URINALYSIS, ROUTINE W REFLEX MICROSCOPIC
Bilirubin Urine: NEGATIVE
Hgb urine dipstick: NEGATIVE
Ketones, ur: NEGATIVE mg/dL
Nitrite: NEGATIVE
Protein, ur: NEGATIVE mg/dL
Specific Gravity, Urine: 1.009 (ref 1.005–1.030)
Urobilinogen, UA: 0.2 mg/dL (ref 0.0–1.0)

## 2011-09-18 LAB — RAPID URINE DRUG SCREEN, HOSP PERFORMED
Amphetamines: NOT DETECTED
Barbiturates: NOT DETECTED
Benzodiazepines: NOT DETECTED
Cocaine: NOT DETECTED
Opiates: NOT DETECTED
Tetrahydrocannabinol: NOT DETECTED

## 2011-09-18 LAB — POCT I-STAT, CHEM 8
Calcium, Ion: 1.27 mmol/L (ref 1.12–1.32)
Creatinine, Ser: 1 mg/dL (ref 0.50–1.35)
Glucose, Bld: 132 mg/dL — ABNORMAL HIGH (ref 70–99)
HCT: 44 % (ref 39.0–52.0)
Hemoglobin: 15 g/dL (ref 13.0–17.0)
Potassium: 3.5 mEq/L (ref 3.5–5.1)
TCO2: 22 mmol/L (ref 0–100)

## 2011-09-18 MED ORDER — SODIUM CHLORIDE 0.9 % IV BOLUS (SEPSIS): 1000.0000 mL | Freq: Once | INTRAVENOUS | Status: DC

## 2011-09-18 MED ORDER — LORAZEPAM 2 MG/ML IJ SOLN
1.0000 mg | Freq: Once | INTRAMUSCULAR | Status: AC
  Administered 2011-09-18: 1 mg via INTRAVENOUS
  Filled 2011-09-18: qty 1

## 2011-09-18 MED ORDER — IOHEXOL 300 MG/ML  SOLN
70.0000 mL | Freq: Once | INTRAMUSCULAR | Status: AC | PRN
  Administered 2011-09-18: 70 mL via INTRAVENOUS

## 2011-09-18 MED ORDER — SODIUM CHLORIDE 0.9 % IV BOLUS (SEPSIS)
1000.0000 mL | Freq: Once | INTRAVENOUS | Status: AC
  Administered 2011-09-18: 1000 mL via INTRAVENOUS

## 2011-09-18 MED ORDER — LORAZEPAM 2 MG/ML IJ SOLN
1.0000 mg | Freq: Once | INTRAMUSCULAR | Status: DC
  Filled 2011-09-18: qty 1

## 2011-09-18 MED ORDER — LORAZEPAM 2 MG/ML IJ SOLN
1.0000 mg | Freq: Once | INTRAMUSCULAR | Status: AC
  Administered 2011-09-18: 1 mg via INTRAVENOUS

## 2011-09-18 NOTE — ED Notes (Signed)
Per ems- pt had witnessed seizure activity this morning, foaming at the mouth, pt rolled off the bed with seizure. Initial HR 160, CBg 118. Unable to palpate radial pulse. Pt has had 700 cc, HR went down to 118. Family reporting unable to urinate x 2 days. Pt responding to voice, pt following commands. Pt not exhibiting post-itical symptoms per EMS. EKG unremarkable shows ST. Pt becoming more responsive during report. Skin warm and dry. Pt has 18 gauge in L. Hand.

## 2011-09-18 NOTE — ED Notes (Signed)
324 cc of urine in bladder per bladder scanner

## 2011-09-18 NOTE — ED Provider Notes (Signed)
History     CSN: [id number]  Arrival date & time 09/18/11  0803   First MD Initiated Contact with Patient 09/18/11 [phone]      Chief Complaint  Patient presents with  . Seizures    (Consider location/radiation/quality/duration/timing/severity/associated sxs/prior treatment) HPI Pt presents via ems s/p witnessed sz, hx from wife.  Pt was watching a movie in bed this am when he began convulsing and foaming at the mouth.  Episode was short, less than a minute and pt was confused after, did not recognize his wife.  Pt is back to his baseline at this time.  C/o mild left hand pain.  Sxs were moderate, no aggravating or alleviating factors.  EMS got cbg of 118, did not appreciate post ictal sx's in pt, gave bolus for tachycardia.  Past Medical History  Diagnosis Date  . Seizures   . Hypertension   . Acid reflux   . Arthritis   . Anxiety   . PTSD (post-traumatic stress disorder)     Past Surgical History  Procedure Date  . Knee surgery     No family history on file.  History  Substance Use Topics  . Smoking status: Current Everyday Smoker -- 2.0 packs/day for 16 years    Types: Cigarettes  . Smokeless tobacco: Former Neurosurgeon    Types: Chew    Quit date: 06/24/2011  . Alcohol Use: 1.2 oz/week    2 Cans of beer per week      Review of Systems  Constitutional: Negative for fever.  Respiratory: Negative for cough.   Gastrointestinal: Negative for nausea, vomiting and abdominal pain.  Musculoskeletal: Positive for arthralgias (mild left hand pain).  Neurological: Positive for seizures.  All other systems reviewed and are negative.    Allergies  Tramadol  Home Medications   Current Outpatient Rx  Name Route Sig Dispense Refill  . ACETAMINOPHEN-CODEINE #3 300-30 MG PO TABS Oral Take 1 tablet by mouth every 4 (four) hours as needed. pain    . ALPRAZOLAM 1 MG PO TABS Oral Take 1 mg by mouth 2 (two) times daily.    Marland Kitchen CARISOPRODOL 350 MG PO TABS Oral Take 350 mg by  mouth 4 (four) times daily as needed. For spasms    . DIPHENHYDRAMINE HCL 25 MG PO CAPS Oral Take 25 mg by mouth at bedtime as needed. For sleep    . FUROSEMIDE 40 MG PO TABS Oral Take 40 mg by mouth every other day.    . QUETIAPINE FUMARATE 200 MG PO TABS Oral Take 200 mg by mouth at bedtime.    Marland Kitchen ZOLPIDEM TARTRATE 5 MG PO TABS Oral Take 5 mg by mouth at bedtime as needed. sleep      BP 140/80  Pulse 91  Temp(Src) 97.9 F (36.6 C) (Oral)  Resp 17  SpO2 99%ra wnl  Physical Exam  Nursing note and vitals reviewed. Constitutional: He appears well-developed and well-nourished.  HENT:  Head: Normocephalic and atraumatic.  Eyes: Right eye exhibits no discharge. Left eye exhibits no discharge.  Neck: Normal range of motion. Neck supple.  Cardiovascular: Regular rhythm and normal heart sounds.  Tachycardia present.   Pulmonary/Chest: Effort normal and breath sounds normal.  Abdominal: Soft. There is no tenderness.  Musculoskeletal: Normal range of motion. He exhibits no tenderness.       Left hand: He exhibits no tenderness and no deformity.  Neurological: He is alert.  Skin: Skin is warm and dry.  Psychiatric: He has a normal  mood and affect. His behavior is normal.    ED Course  Procedures (including critical care time)  Labs Reviewed  POCT I-STAT, CHEM 8 - Abnormal; Notable for the following:    Glucose, Bld 132 (*)    All other components within normal limits  D-DIMER, QUANTITATIVE - Abnormal; Notable for the following:    D-Dimer, Quant 2.46 (*)    All other components within normal limits  URINE RAPID DRUG SCREEN (HOSP PERFORMED)  URINALYSIS, ROUTINE W REFLEX MICROSCOPIC   Ct Angio Chest W/cm &/or Wo Cm  09/18/2011  *RADIOLOGY REPORT*  Clinical Data: Hypertension, elevated D-dimer.  CT ANGIOGRAPHY CHEST  Technique:  Multidetector CT imaging of the chest using the standard protocol during bolus administration of intravenous contrast. Multiplanar reconstructed images  including MIPs were obtained and reviewed to evaluate the vascular anatomy.  Contrast:  70 ml Omnipaque 350 IV  Comparison: Chest x-ray 06/12/2011  Findings: Heart is normal size. Aorta is normal caliber. No mediastinal, hilar, or axillary adenopathy.  Visualized thyroid and chest wall soft tissues unremarkable.  Review of lung windows demonstrates moderate COPD changes. Clustered ground-glass nodular densities are noted in the posterior right upper lobe which likely reflects a small airways disease/alveolitis.  No pleural effusions.  No acute bony abnormality.  IMPRESSION: No evidence of pulmonary embolus.  Moderate COPD.  Small clustered ground-glass nodular densities in the posterior right upper lobe, likely a small focal area of small airways disease.  Original Report Authenticated By: Cyndie Chime, M.D.     1. Seizure   2. Tachycardia      EKG: sinus tachycardia.  MDM  Pt presents s/p episode of convulsions associate with foaming at the mouth.  Pt recently diagnosed with possible sz d/o, has not had EEG to confirm.  Takes xanax for anxiety, not currently on antiepileptic.  Considered bzd withdrawal but pt has been taking his xanax as prescribed.  Pt asx at this time.  Giving ativan for sz ppx.  Pt stable.  Pt is tachycardic, ddimer ordered.  ddimer elevated, ct chest shows no pe, HR down to 90s after ivf's, uds neg, pt has remained sz free here, nl glucose and no electrolyte imbalance.  Pt has not seen neurologist, will have him f/u with Dr Adele Barthel, MD 09/18/11 [phone]

## 2011-09-18 NOTE — ED Notes (Signed)
Resident at bedside. Pt wife at bedside. Family reporting during seizure like episode pt "lost teeth". Pt has dentures, family has in their possession

## 2011-09-18 NOTE — ED Provider Notes (Signed)
I saw and evaluated the patient, reviewed the resident's note and I agree with the findings and plan.  Convulsions from home.  Possible history of seizures, has not had EEG.  Admitted 3 weeks ago with tremor, tachycardia, probable anticholinergic OD.   Tachycardic and orthostatic today.  No mydriasis, no neuro deficits. No CP or SOB.  Glynn Octave, MD 09/18/11 1531

## 2011-09-18 NOTE — Discharge Instructions (Signed)
Driving and Equipment Restrictions Some medical problems make it dangerous to drive, ride a bike, or use machines. Some of these problems are:  A hard blow to the head (concussion).   Passing out (fainting).   Twitching and shaking (seizures).   Low blood sugar.   Taking medicine to help you relax (sedatives).   Taking pain medicines.   Wearing an eye patch.   Wearing splints. This can make it hard to use parts of your body that you need to drive safely.  HOME CARE   Do not drive until your doctor says it is okay.   Do not use machines until your doctor says it is okay.  You may need a form signed by your doctor (medical release) before you can drive again. You may also need this form before you do other tasks where you need to be fully alert. MAKE SURE YOU:  Understand these instructions.   Will watch your condition.   Will get help right away if you are not doing well or get worse.  Document Released: 07/17/2004 Document Revised: 05/29/2011 Document Reviewed: 10/17/2009 [facility] Patient Information 2012 [place], [place].Seizure, Adult A seizure happens when there is uncontrolled activity in the brain. Most seizures cause either the whole body to shake or just one part of the body to shake. The shaking is called a convulsion. Other seizures cause the body or a part of the body to tense up.  HOME CARE   Keep all follow-up visits.   Take medicines as told by your doctor.   Do not swim until your doctor says it is okay.   Do not drive until your doctor says it is okay.   Make sure your friends, loved ones, and coworkers know to do the following things if you have a seizure:   Lower you gently to the ground.   Keep the area around you free of things that might fall or things that you might grab.   Avoid putting anything in or near your mouth.   Call your local emergency services (911 in [place]  GET HELP RIGHT AWAY IF:   You have confusion or feel dazed.   You feel  sleepy.  MAKE SURE YOU:   Understand these instructions.   Will watch your condition.   Will get help right away if you are not doing well or get worse.  Document Released: 11/26/2007 Document Revised: 05/29/2011 Document Reviewed: 05/28/2011 [facility] Patient Information 2012 [place], [place].  Return for any new or worsening symptoms or any other concerns.

## 2011-10-19 ENCOUNTER — Encounter (HOSPITAL_COMMUNITY): Payer: Self-pay | Admitting: *Deleted

## 2011-10-19 ENCOUNTER — Emergency Department (HOSPITAL_COMMUNITY)
Admission: EM | Admit: 2011-10-19 | Discharge: 2011-10-19 | Disposition: A | Discharge status: Home / Self Care | Payer: Medicaid Other | Attending: Emergency Medicine | Admitting: Emergency Medicine

## 2011-10-19 DIAGNOSIS — F411 Generalized anxiety disorder: Secondary | ICD-10-CM | POA: Insufficient documentation

## 2011-10-19 DIAGNOSIS — Z9889 Other specified postprocedural states: Secondary | ICD-10-CM | POA: Insufficient documentation

## 2011-10-19 DIAGNOSIS — Z79899 Other long term (current) drug therapy: Secondary | ICD-10-CM | POA: Insufficient documentation

## 2011-10-19 DIAGNOSIS — F29 Unspecified psychosis not due to a substance or known physiological condition: Secondary | ICD-10-CM | POA: Insufficient documentation

## 2011-10-19 DIAGNOSIS — R404 Transient alteration of awareness: Secondary | ICD-10-CM | POA: Insufficient documentation

## 2011-10-19 DIAGNOSIS — R569 Unspecified convulsions: Secondary | ICD-10-CM | POA: Insufficient documentation

## 2011-10-19 DIAGNOSIS — I10 Essential (primary) hypertension: Secondary | ICD-10-CM | POA: Insufficient documentation

## 2011-10-19 DIAGNOSIS — M129 Arthropathy, unspecified: Secondary | ICD-10-CM | POA: Insufficient documentation

## 2011-10-19 DIAGNOSIS — F17200 Nicotine dependence, unspecified, uncomplicated: Secondary | ICD-10-CM | POA: Insufficient documentation

## 2011-10-19 DIAGNOSIS — K219 Gastro-esophageal reflux disease without esophagitis: Secondary | ICD-10-CM | POA: Insufficient documentation

## 2011-10-19 DIAGNOSIS — IMO0001 Reserved for inherently not codable concepts without codable children: Secondary | ICD-10-CM | POA: Insufficient documentation

## 2011-10-19 DIAGNOSIS — R509 Fever, unspecified: Secondary | ICD-10-CM | POA: Insufficient documentation

## 2011-10-19 LAB — POCT I-STAT, CHEM 8
Chloride: 103 mEq/L (ref 96–112)
Creatinine, Ser: 1.1 mg/dL (ref 0.50–1.35)
Glucose, Bld: 94 mg/dL (ref 70–99)
HCT: 47 % (ref 39.0–52.0)
Potassium: 3.6 mEq/L (ref 3.5–5.1)
Sodium: 142 mEq/L (ref 135–145)

## 2011-10-19 NOTE — ED Notes (Signed)
PT states feeling much better than before

## 2011-10-19 NOTE — ED Provider Notes (Signed)
  I performed a history and physical examination of Arthur Baird and discussed his management with Dr. Vear Clock.  I agree with the history, physical, assessment, and plan of care, with the following exceptions: None  Hx non-epileptic sz.  Presents after episode.  Back to baseline.  On recording by mother pt shakes head when asked questions.  Labs unremarkable.  No loss of bowel or bladder function.  Dc home with neuro follow up for eeg  Tildon Husky, MD 10/19/11 2332

## 2011-10-19 NOTE — ED Notes (Signed)
Setup Pelvic Cart

## 2011-10-19 NOTE — ED Notes (Signed)
EDP into room 

## 2011-10-19 NOTE — ED Notes (Addendum)
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 No meds or IV by EMS PTA. Pt takes ativan and soma. Followed by Dr. Chauncey Reading, [facility].

## 2011-10-19 NOTE — ED Provider Notes (Signed)
History     CSN: [id number]  Arrival date & time 10/19/11  5366   First MD Initiated Contact with Patient 10/19/11 1929      Chief Complaint  Patient presents with  . Seizures    (Consider location/radiation/quality/duration/timing/severity/associated sxs/prior treatment) Patient is a 31 y.o. male presenting with seizures. The history is provided by the patient.  Seizures  This is a recurrent problem. The current episode started 6 to 12 hours ago. The problem has been resolved. There were 2 to 3 seizures. The most recent episode lasted 2 to 5 minutes. Associated symptoms include sleepiness and confusion. Pertinent negatives include no chest pain, no nausea and no vomiting. Characteristics include rhythmic jerking. The episode was witnessed. The seizures did not continue in the ED. The seizure(s) had no focality. Possible causes do not include med or dosage change, sleep deprivation or missed seizure meds (only takes regular ativan for anxiety). There has been no fever. The fever has been present for less than 1 day. There were no medications administered prior to arrival.    Past Medical History  Diagnosis Date  . Seizures   . Hypertension   . Acid reflux   . Arthritis   . Anxiety   . PTSD (post-traumatic stress disorder)     Past Surgical History  Procedure Date  . Knee surgery     No family history on file.  History  Substance Use Topics  . Smoking status: Current Everyday Smoker -- 2.0 packs/day for 16 years    Types: Cigarettes  . Smokeless tobacco: Former Neurosurgeon    Types: Chew    Quit date: 06/24/2011  . Alcohol Use: 1.2 oz/week    2 Cans of beer per week      Review of Systems  Constitutional: Negative for fever.  HENT: Negative for neck pain.   Respiratory: Negative for shortness of breath.   Cardiovascular: Negative for chest pain.  Gastrointestinal: Negative for nausea, vomiting and abdominal pain.  Musculoskeletal: Positive for myalgias.  Skin:  Negative for rash.  Neurological: Positive for seizures.  Psychiatric/Behavioral: Positive for confusion.  All other systems reviewed and are negative.    Allergies  Tramadol  Home Medications   Current Outpatient Rx  Name Route Sig Dispense Refill  . ACETAMINOPHEN-CODEINE #3 300-30 MG PO TABS Oral Take 1 tablet by mouth every 4 (four) hours as needed. pain    . ALPRAZOLAM 1 MG PO TABS Oral Take 1 mg by mouth 2 (two) times daily.    Marland Kitchen CARISOPRODOL 350 MG PO TABS Oral Take 350 mg by mouth 4 (four) times daily as needed. For spasms    . DIPHENHYDRAMINE HCL 25 MG PO CAPS Oral Take 25 mg by mouth at bedtime as needed. For sleep    . FUROSEMIDE 40 MG PO TABS Oral Take 40 mg by mouth every other day.    . QUETIAPINE FUMARATE 200 MG PO TABS Oral Take 200 mg by mouth at bedtime.    Marland Kitchen ZOLPIDEM TARTRATE 5 MG PO TABS Oral Take 5 mg by mouth at bedtime as needed. sleep      BP 127/78  Pulse 87  Temp(Src) 97.5 F (36.4 C) (Oral)  Resp 16  SpO2 97%  Physical Exam  Constitutional: He is oriented to person, place, and time. He appears well-developed and well-nourished.  HENT:  Head: Normocephalic and atraumatic.  Eyes: EOM are normal. Pupils are equal, round, and reactive to light.  Neck: Normal range of motion.  Cardiovascular: Normal  rate, regular rhythm and normal heart sounds.   Pulmonary/Chest: Effort normal and breath sounds normal. No respiratory distress.  Abdominal: Soft. There is no tenderness.  Musculoskeletal: Normal range of motion.  Neurological: He is alert and oriented to person, place, and time. He has normal strength. No cranial nerve deficit or sensory deficit.  Skin: Skin is warm and dry.  Psychiatric: He has a normal mood and affect.    ED Course  Procedures (including critical care time)  Date: 10/19/2011  Rate: 84  Rhythm: normal sinus rhythm  QRS Axis: normal  Intervals: normal  ST/T Wave abnormalities: nonspecific ST/T changes  Conduction  Disutrbances:none  Narrative Interpretation:   Old EKG Reviewed: unchanged   Labs Reviewed  GLUCOSE, CAPILLARY - Abnormal; Notable for the following:    Glucose-Capillary 102 (*)    All other components within normal limits  POCT I-STAT, CHEM 8   No results found.   1. Seizure       MDM   Patient with history of previous diagnosis of pseudoseizures presents from home after 2 seizure-like episodes today. Each episode was similar to this he has had before. The first episode per his family member reports that he tensed his upper extremities and she was able to talk to him and have him call down at which point the symptoms resolved. Second episode he was sitting at the dinner table with tensing of his upper extremities. She asked him questions including whether he wanted her to call EMS and he was able to respond by shaking his head no. This progressed for several minutes until the patient later fell over on the couch with the-like movements of upper tremors. There was no tongue biting. There is no loss of bowel or bladder. There is no apparent injury.  On my evaluation the patient is drowsy but alert. He answers questions appropriately. No focal neurologic deficits. There are no signs of trauma. The patient does take multiple medications including Xanax, Ativan, and soma. He takes the Ativan every 6 hours scheduled. Family prefers not to give him the Xanax. He has not missed any doses thus do not feel consistent with withdrawal. His vital stable here, there is no signs of toxidrome or other overdose.   The video of the second episode where patient was alert and answering questions with his upper extremities, doubt true epileptic seizure but consider partial seizure.  EKG benign. I-STAT without acute abnormality. Patient appropriately awake to remain her ED visit. Recommended close followup with his neurologist Dr. Basilio Cairo for EEG and further recommendations. Stressed importance of neuro followup  and ruling out true epileptic seizures.       Donnamarie Poag, MD 10/19/11 2259

## 2011-10-19 NOTE — ED Notes (Signed)
Patient given discharge paperwork; went over discharge instructions with patient.  Instructed patient to follow up with his neurologist first thing tomorrow morning, and to return to the ED for new, worsening, or concerning symptoms.

## 2012-02-12 ENCOUNTER — Emergency Department (HOSPITAL_COMMUNITY): Payer: Medicaid Other

## 2012-02-12 ENCOUNTER — Emergency Department (HOSPITAL_COMMUNITY)
Admission: EM | Admit: 2012-02-12 | Discharge: 2012-02-13 | Disposition: A | Discharge status: Home / Self Care | Payer: Medicaid Other | Attending: Emergency Medicine | Admitting: Emergency Medicine

## 2012-02-12 ENCOUNTER — Encounter (HOSPITAL_COMMUNITY): Payer: Self-pay | Admitting: Emergency Medicine

## 2012-02-12 DIAGNOSIS — K219 Gastro-esophageal reflux disease without esophagitis: Secondary | ICD-10-CM | POA: Insufficient documentation

## 2012-02-12 DIAGNOSIS — M129 Arthropathy, unspecified: Secondary | ICD-10-CM | POA: Insufficient documentation

## 2012-02-12 DIAGNOSIS — S99929A Unspecified injury of unspecified foot, initial encounter: Secondary | ICD-10-CM | POA: Insufficient documentation

## 2012-02-12 DIAGNOSIS — M25561 Pain in right knee: Secondary | ICD-10-CM

## 2012-02-12 DIAGNOSIS — G40909 Epilepsy, unspecified, not intractable, without status epilepticus: Secondary | ICD-10-CM | POA: Insufficient documentation

## 2012-02-12 DIAGNOSIS — I10 Essential (primary) hypertension: Secondary | ICD-10-CM | POA: Insufficient documentation

## 2012-02-12 DIAGNOSIS — F17200 Nicotine dependence, unspecified, uncomplicated: Secondary | ICD-10-CM | POA: Insufficient documentation

## 2012-02-12 DIAGNOSIS — X500XXA Overexertion from strenuous movement or load, initial encounter: Secondary | ICD-10-CM | POA: Insufficient documentation

## 2012-02-12 DIAGNOSIS — S8990XA Unspecified injury of unspecified lower leg, initial encounter: Secondary | ICD-10-CM | POA: Insufficient documentation

## 2012-02-12 DIAGNOSIS — F4310 Post-traumatic stress disorder, unspecified: Secondary | ICD-10-CM | POA: Insufficient documentation

## 2012-02-12 NOTE — ED Notes (Signed)
Patient complaining of right knee pain; patient was out with his son when he slipped and fell on some rocks -- twisted right knee.  Patient complaining of pain and swelling at area; patient reports two past surgeries on this knee.  Patient ambulatory in triage; able to move extremities; limited movement in right leg due to pain.

## 2012-02-13 MED ORDER — HYDROCODONE-ACETAMINOPHEN 5-325 MG PO TABS
1.0000 | ORAL_TABLET | Freq: Once | ORAL | Status: AC
  Administered 2012-02-13: 1 via ORAL
  Filled 2012-02-13: qty 1

## 2012-02-13 MED ORDER — HYDROCODONE-ACETAMINOPHEN 5-325 MG PO TABS: 1.0000 | ORAL_TABLET | ORAL | Status: AC | PRN

## 2012-02-13 NOTE — ED Notes (Signed)
Pt offered heating pack or ice pack to help with knee pain; refused both offers. Pt sts "I just want to see the doctor to know if it's broken." Pt informed that the PA will be in as soon as she could.

## 2012-02-13 NOTE — ED Provider Notes (Signed)
History     CSN: [id number]  Arrival date & time 02/12/12  2134   First MD Initiated Contact with Patient 02/13/12 0053      Chief Complaint  Patient presents with  . Knee Injury    (Consider location/radiation/quality/duration/timing/severity/associated sxs/prior treatment) HPI Comments: Patient reports he was jumping between rocks while fishing and his right foot got caught in a hole causing his knee to twist.  Reports immediate pain in his right knee that has been constant, is worse with ambulation and palpation.  Describes it as feeling like "a car ran over it in the front and a horse kicked it in the back"  Also described as feeling like "glass" shards inside.  Pain radiates to proximal shin.  Denies weakness or numbness of the leg, denies swelling, denies ankle, foot, or hip pain. Denies other injury.    The history is provided by the patient.    Past Medical History  Diagnosis Date  . Seizures   . Hypertension   . Acid reflux   . Arthritis   . Anxiety   . PTSD (post-traumatic stress disorder)     Past Surgical History  Procedure Date  . Knee surgery     History reviewed. No pertinent family history.  History  Substance Use Topics  . Smoking status: Current Everyday Smoker -- 2.0 packs/day for 16 years    Types: Cigarettes  . Smokeless tobacco: Former Neurosurgeon    Types: Chew    Quit date: 06/24/2011  . Alcohol Use: 1.2 oz/week    2 Cans of beer per week      Review of Systems  Cardiovascular: Negative for leg swelling.  Musculoskeletal: Positive for arthralgias. Negative for joint swelling.  Skin: Negative for color change and pallor.  Neurological: Negative for weakness and numbness.    Allergies  Soma and Tramadol  Home Medications   Current Outpatient Rx  Name Route Sig Dispense Refill  . ALPRAZOLAM 1 MG PO TABS Oral Take 1 mg by mouth 2 (two) times daily.    Marland Kitchen CARISOPRODOL 350 MG PO TABS Oral Take 350 mg by mouth 4 (four) times daily as needed.  For muscle spasms    . DIPHENHYDRAMINE HCL 25 MG PO CAPS Oral Take 25 mg by mouth at bedtime as needed. For sleep    . FUROSEMIDE 40 MG PO TABS Oral Take 40 mg by mouth every other day.    Marland Kitchen LORAZEPAM 1 MG PO TABS Oral Take 1 mg by mouth every 8 (eight) hours.    Marland Kitchen ZOLPIDEM TARTRATE 5 MG PO TABS Oral Take 5 mg by mouth at bedtime as needed. For sleep      BP 125/69  Pulse 94  Temp 98.1 F (36.7 C) (Oral)  Resp 16  SpO2 99%  Physical Exam  Nursing note and vitals reviewed. Constitutional: He appears well-developed and well-nourished. No distress.  HENT:  Head: Normocephalic and atraumatic.  Neck: Neck supple.  Pulmonary/Chest: Effort normal.  Musculoskeletal:       Right knee: He exhibits decreased range of motion. He exhibits no swelling, no effusion, no ecchymosis, no deformity, no laceration, no erythema and normal alignment. tenderness found. Medial joint line and lateral joint line tenderness noted.       Right ankle: Normal.       Right foot: Normal.       Pt has pain with all testing of knee including anterior and posterior stress, axial loading, and valgus and varus stress.  Diffusely  tender to palpation anteriorly.  Pt not able to fully extend leg due to pain.  Able to bend to 90 degrees.  Sensation intact, distal pulses intact.    Neurological: He is alert.  Skin: He is not diaphoretic.    ED Course  Procedures (including critical care time)  Labs Reviewed - No data to display Dg Knee Complete 4 Views Right  02/12/2012  *RADIOLOGY REPORT*  Clinical Data: Right knee pain after twisting injury.  RIGHT KNEE - COMPLETE 4+ VIEW  Comparison: 06/12/2011  Findings: Postoperative the bone defects in the right knee consistent with anterior cruciate ligament repair.  Mild narrowing of the medial compartment with slight hypertrophy suggesting degenerative change.  No evidence of acute fracture or subluxation. Bone cortex and trabecular architecture appear intact.  No focal bone lesion  or bone destruction.  No significant effusion.  No significant change since previous study.  IMPRESSION: No acute bony abnormalities.   Original Report Authenticated By: Marlon Pel, M.D.      1. Right knee pain       MDM  Pt with hx ACL surgery x 2 by Dr Luiz Blare presents with new twisting injury to right knee.  Knee exam unclear as patient tender to palpation diffusely and has pain with every test.  Neurovascularly intact.  Pt placed in knee immobilizer, given crutches, d/c home with norco, orthopedic follow up.  Discussed all results with patient.  Pt given return precautions.  Pt verbalizes understanding and agrees with plan.          [place], [place] 02/13/12 [phone]

## 2012-02-13 NOTE — ED Notes (Signed)
NURSE EXPLAINED DELAY / PROCESS . NT NOTIFIED THAT PT. REQUESTING TO SEE PROVIDER.

## 2012-02-13 NOTE — ED Provider Notes (Signed)
Medical screening examination/treatment/procedure(s) were performed by non-physician practitioner and as supervising physician I was immediately available for consultation/collaboration. Devoria Albe, MD, FACEP   Ward Givens, MD 02/13/12 [phone]

## 2012-03-09 ENCOUNTER — Other Ambulatory Visit: Payer: Self-pay | Admitting: Nephrology

## 2012-05-14 ENCOUNTER — Encounter (HOSPITAL_COMMUNITY): Payer: Self-pay | Admitting: Nurse Practitioner

## 2012-05-14 ENCOUNTER — Emergency Department (HOSPITAL_COMMUNITY)
Admission: EM | Admit: 2012-05-14 | Discharge: 2012-05-15 | Disposition: A | Discharge status: Home / Self Care | Payer: Medicaid Other | Attending: Emergency Medicine | Admitting: Emergency Medicine

## 2012-05-14 DIAGNOSIS — F1410 Cocaine abuse, uncomplicated: Secondary | ICD-10-CM

## 2012-05-14 DIAGNOSIS — R569 Unspecified convulsions: Secondary | ICD-10-CM

## 2012-05-14 DIAGNOSIS — I10 Essential (primary) hypertension: Secondary | ICD-10-CM | POA: Insufficient documentation

## 2012-05-14 DIAGNOSIS — F17200 Nicotine dependence, unspecified, uncomplicated: Secondary | ICD-10-CM | POA: Insufficient documentation

## 2012-05-14 DIAGNOSIS — F411 Generalized anxiety disorder: Secondary | ICD-10-CM | POA: Insufficient documentation

## 2012-05-14 DIAGNOSIS — Z8719 Personal history of other diseases of the digestive system: Secondary | ICD-10-CM | POA: Insufficient documentation

## 2012-05-14 DIAGNOSIS — M129 Arthropathy, unspecified: Secondary | ICD-10-CM | POA: Insufficient documentation

## 2012-05-14 DIAGNOSIS — Z79899 Other long term (current) drug therapy: Secondary | ICD-10-CM | POA: Insufficient documentation

## 2012-05-14 DIAGNOSIS — R7989 Other specified abnormal findings of blood chemistry: Secondary | ICD-10-CM

## 2012-05-14 DIAGNOSIS — F4310 Post-traumatic stress disorder, unspecified: Secondary | ICD-10-CM | POA: Insufficient documentation

## 2012-05-14 LAB — COMPREHENSIVE METABOLIC PANEL
ALT: 1143 U/L — ABNORMAL HIGH (ref 0–53)
AST: 566 U/L — ABNORMAL HIGH (ref 0–37)
Albumin: 3.6 g/dL (ref 3.5–5.2)
Alkaline Phosphatase: 188 U/L — ABNORMAL HIGH (ref 39–117)
BUN: 8 mg/dL (ref 6–23)
CO2: 28 mEq/L (ref 19–32)
Calcium: 9.3 mg/dL (ref 8.4–10.5)
Chloride: 103 mEq/L (ref 96–112)
Creatinine, Ser: 0.79 mg/dL (ref 0.50–1.35)
GFR calc Af Amer: >90 mL/min (ref >90)
GFR calc non Af Amer: >90 mL/min (ref >90)
Glucose, Bld: 94 mg/dL (ref 70–99)
Potassium: 3.6 mEq/L (ref 3.5–5.1)
Sodium: 140 mEq/L (ref 135–145)
Total Bilirubin: 2.6 mg/dL — ABNORMAL HIGH (ref 0.3–1.2)
Total Protein: 6.4 g/dL (ref 6.0–8.3)

## 2012-05-14 LAB — CBC WITH DIFFERENTIAL/PLATELET
Basophils Absolute: 0.1 10*3/uL (ref 0.0–0.1)
Basophils Relative: 1 % (ref 0–1)
Eosinophils Absolute: 0.2 10*3/uL (ref 0.0–0.7)
Eosinophils Relative: 4 % (ref 0–5)
HCT: 41.6 % (ref 39.0–52.0)
Hemoglobin: 14.4 g/dL (ref 13.0–17.0)
Lymphocytes Relative: 42 % (ref 12–46)
Lymphs Abs: 2.4 10*3/uL (ref 0.7–4.0)
MCH: 31 pg (ref 26.0–34.0)
MCHC: 34.6 g/dL (ref 30.0–36.0)
MCV: 89.7 fL (ref 78.0–100.0)
Monocytes Absolute: 0.6 10*3/uL (ref 0.1–1.0)
Monocytes Relative: 10 % (ref 3–12)
Neutro Abs: 2.5 10*3/uL (ref 1.7–7.7)
Neutrophils Relative %: 44 % (ref 43–77)
Platelets: 204 10*3/uL (ref 150–400)
RBC: 4.64 MIL/uL (ref 4.22–5.81)
RDW: 15.1 % (ref 11.5–15.5)
WBC: 5.8 10*3/uL (ref 4.0–10.5)

## 2012-05-14 LAB — URINALYSIS, ROUTINE W REFLEX MICROSCOPIC
Glucose, UA: NEGATIVE mg/dL
Hgb urine dipstick: NEGATIVE
Ketones, ur: 15 mg/dL — AB
Nitrite: NEGATIVE
Protein, ur: NEGATIVE mg/dL
Specific Gravity, Urine: 1.019 (ref 1.005–1.030)
Urobilinogen, UA: 2 mg/dL — ABNORMAL HIGH (ref 0.0–1.0)
pH: 6.5 (ref 5.0–8.0)

## 2012-05-14 LAB — RAPID URINE DRUG SCREEN, HOSP PERFORMED
Amphetamines: NOT DETECTED
Barbiturates: NOT DETECTED
Benzodiazepines: POSITIVE — AB
Cocaine: POSITIVE — AB
Opiates: NOT DETECTED
Tetrahydrocannabinol: NOT DETECTED

## 2012-05-14 LAB — URINE MICROSCOPIC-ADD ON

## 2012-05-14 LAB — AMMONIA: Ammonia: 57 umol/L (ref 11–60)

## 2012-05-14 LAB — ETHANOL: Alcohol, Ethyl (B): <11 mg/dL (ref 0–11)

## 2012-05-14 LAB — LIPASE, BLOOD: Lipase: 15 U/L (ref 11–59)

## 2012-05-14 MED ORDER — IOHEXOL 300 MG/ML  SOLN
20.0000 mL | INTRAMUSCULAR | Status: AC
  Administered 2012-05-15 (×2): 20 mL via ORAL

## 2012-05-14 NOTE — ED Notes (Signed)
Pt has been hurting over bilateral flank area, pt's wife st's.  Pt st's he hasn't been able to go to the bathroom so his doctor put him on lasix.  Pt also st's he hasn't been able to gain weight ever [place].

## 2012-05-14 NOTE — ED Notes (Addendum)
Per wife pt. 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 Wife states she saw he was getting ready to have a seizure and begun to shake "and talked him out of it" by laying pt. On ground and encouraging to breathe. No seizure activity was noted by wife. Pt states he felt weak and left arm was going numb then blacked out and doesn't remember anything. Pt wife states giving him ativan, xanax and soma at home prior to arrival.

## 2012-05-14 NOTE — ED Provider Notes (Signed)
History     CSN: [id number]  Arrival date & time 05/14/12  1821   First MD Initiated Contact with Patient 05/14/12 1910      Chief Complaint  Patient presents with  . Seizures    (Consider location/radiation/quality/duration/timing/severity/associated sxs/prior treatment) HPI The patient presents to the ED with a chief compliant of Seizure. His wife reports he reports not feeling well as if he was about to have a seizure around 2 PM this afternoon, she gave him 0.5 mg of Ativan.  She reports he did not have a seizure at this time. She states he went to the store with a friend upon returning he reports feeling "not right" and his wife called EMS.  She reports that he did not have seizure like activity today.   She later reports giving him 0.5 mg of Ativan at 10 AM.     Past Medical History  Diagnosis Date  . Seizures   . Hypertension   . Acid reflux   . Arthritis   . Anxiety   . PTSD (post-traumatic stress disorder)     Past Surgical History  Procedure Date  . Knee surgery     No family history on file.  History  Substance Use Topics  . Smoking status: Current Every Day Smoker -- 2.0 packs/day for 16 years    Types: Cigarettes  . Smokeless tobacco: Former Neurosurgeon    Types: Chew    Quit date: 06/24/2011  . Alcohol Use: 1.2 oz/week    2 Cans of beer per week      Review of Systems All other systems negative except as documented in the HPI. All pertinent positives and negatives as reviewed in the HPI.  Allergies  Tramadol  Home Medications   Current Outpatient Rx  Name  Route  Sig  Dispense  Refill  . ALPRAZOLAM 1 MG PO TABS   Oral   Take 1 mg by mouth 2 (two) times daily.         Marland Kitchen CARISOPRODOL 350 MG PO TABS   Oral   Take 350 mg by mouth 4 (four) times daily as needed. For muscle spasms         . DIPHENHYDRAMINE HCL 25 MG PO CAPS   Oral   Take 25 mg by mouth at bedtime as needed. For sleep         . FUROSEMIDE 40 MG PO TABS   Oral   Take 40  mg by mouth every other day.         Marland Kitchen LORAZEPAM 1 MG PO TABS   Oral   Take 1 mg by mouth every 8 (eight) hours.         Marland Kitchen ZOLPIDEM TARTRATE 5 MG PO TABS   Oral   Take 5 mg by mouth at bedtime as needed. For sleep           BP 121/77  Pulse 95  Temp 98 F (36.7 C) (Oral)  Resp 19  SpO2 100%  Physical Exam  Constitutional: He is oriented to person, place, and time. He appears well-developed and well-nourished.  HENT:  Head: Normocephalic and atraumatic.  Mouth/Throat: Oropharynx is clear and moist.  Eyes: EOM are normal. Pupils are equal, round, and reactive to light.  Neck: Normal range of motion. Neck supple.  Cardiovascular: Normal rate, regular rhythm, S1 normal, S2 normal and normal heart sounds.  Exam reveals no friction rub.   No murmur heard. Pulmonary/Chest: Effort normal and breath sounds normal.  No respiratory distress.  Abdominal: Soft. Bowel sounds are normal. He exhibits no distension and no mass. There is tenderness in the suprapubic area. There is no rebound and no guarding.  Neurological: He is alert and oriented to person, place, and time. He has normal strength. He is not disoriented. He displays no atrophy. No cranial nerve deficit or sensory deficit. He exhibits normal muscle tone. Coordination and gait normal. GCS eye subscore is 4. GCS verbal subscore is 5. GCS motor subscore is 6.  Skin: Skin is warm and dry.    ED Course  Procedures (including critical care time)   The patient has been observed over many hours here in the ER. The patient has no true seizure like history given by him or the wife. The patient will be asked. To follow up with his PCP. The patient has been stable while here in the ER. The patient has elevated liver enzymes and he is to follow up with his PCP for this. The patient has been stable here. The patient has no encephalopathic symptoms. The patient has no abdominal pain.  MDM  MDM Reviewed: nursing note and  vitals Interpretation: labs, ECG, x-ray and CT scan    Date: 05/15/2012  Rate: 94  Rhythm: normal sinus rhythm  QRS Axis: normal  Intervals: normal  ST/T Wave abnormalities: nonspecific ST changes  Conduction Disutrbances:none  Narrative Interpretation:   Old EKG Reviewed: unchanged           Carlyle Dolly, PA-C 05/15/12 1543  Carlyle Dolly, PA-C 05/15/12 1750

## 2012-05-14 NOTE — ED Notes (Signed)
Per EMS pt. Was having a sip of beer felt weak and feeling like he was going to have a seizure. Seizure activity not witnessed. Pt lethargic but answered questions appropriately. CBG 98 BP 110/70 HR 90's Pt has hx of seizure not on meds.

## 2012-05-14 NOTE — ED Notes (Signed)
Pt became defensive when asked about all the medications he's taken today, pt thinks we're trying to label him as a drug addict.  Informed pt that we aren't calling him a drug addict that we just need to know all the information in order to properly care for him.

## 2012-05-15 ENCOUNTER — Encounter (HOSPITAL_COMMUNITY): Payer: Self-pay | Admitting: Radiology

## 2012-05-15 ENCOUNTER — Emergency Department (HOSPITAL_COMMUNITY): Payer: Medicaid Other

## 2012-05-15 MED ORDER — IOHEXOL 300 MG/ML  SOLN
80.0000 mL | Freq: Once | INTRAMUSCULAR | Status: AC | PRN
  Administered 2012-05-15: 80 mL via INTRAVENOUS

## 2012-05-15 NOTE — ED Provider Notes (Signed)
Medical screening examination/treatment/procedure(s) were performed by non-physician practitioner and as supervising physician I was immediately available for consultation/collaboration.   Julie Manly, MD 05/15/12 0753 

## 2012-05-15 NOTE — ED Provider Notes (Signed)
Medical screening examination/treatment/procedure(s) were conducted as a shared visit with non-physician practitioner(s) and myself.  I personally evaluated the patient during the encounter.  No seizure activity noted.  Elevated LFT's noted.  No hepatic tenderness.  F/u primary care  Donnetta Hutching, MD 05/15/12 1752

## 2012-05-15 NOTE — ED Notes (Signed)
Notified CT pt is done wit oral contrast

## 2012-05-15 NOTE — ED Notes (Signed)
Patient transported to CT 

## 2012-05-15 NOTE — ED Provider Notes (Signed)
Arthur Baird S 1:00 AM patient discussed in sign out with [facility]. Patient with reported history of pseudoseizures with poor history of past workup. Unclear if he had adequate evaluation by neurologist but is unsure. Presenting with similar symptoms today. Patient was noted to have elevated LFTs. He has nontender benign abdomen and a CT scan is pending for further evaluation. He was also found to be positive for cocaine and barbiturates.   CT scan unremarkable. No concerning findings about the liver. At this time will have patient return home and followup with his PCP and GI specialist for continued evaluation of his elevated LFTs. We'll also give neurology referral for further workup of possible and questionable seizure activity.  [name] [place], [place] 05/15/12 [phone]

## 2012-07-25 ENCOUNTER — Emergency Department (HOSPITAL_COMMUNITY): Payer: Medicaid Other

## 2012-07-25 ENCOUNTER — Encounter (HOSPITAL_COMMUNITY): Payer: Self-pay | Admitting: Emergency Medicine

## 2012-07-25 ENCOUNTER — Emergency Department (HOSPITAL_COMMUNITY)
Admission: EM | Admit: 2012-07-25 | Discharge: 2012-07-25 | Disposition: A | Discharge status: Home / Self Care | Payer: Medicaid Other | Attending: Emergency Medicine | Admitting: Emergency Medicine

## 2012-07-25 DIAGNOSIS — F1010 Alcohol abuse, uncomplicated: Secondary | ICD-10-CM

## 2012-07-25 DIAGNOSIS — F17200 Nicotine dependence, unspecified, uncomplicated: Secondary | ICD-10-CM | POA: Insufficient documentation

## 2012-07-25 DIAGNOSIS — Z79899 Other long term (current) drug therapy: Secondary | ICD-10-CM | POA: Insufficient documentation

## 2012-07-25 DIAGNOSIS — F4310 Post-traumatic stress disorder, unspecified: Secondary | ICD-10-CM | POA: Insufficient documentation

## 2012-07-25 DIAGNOSIS — Z8719 Personal history of other diseases of the digestive system: Secondary | ICD-10-CM | POA: Insufficient documentation

## 2012-07-25 DIAGNOSIS — Z8669 Personal history of other diseases of the nervous system and sense organs: Secondary | ICD-10-CM | POA: Insufficient documentation

## 2012-07-25 DIAGNOSIS — T43024A Poisoning by tetracyclic antidepressants, undetermined, initial encounter: Secondary | ICD-10-CM | POA: Insufficient documentation

## 2012-07-25 DIAGNOSIS — F1410 Cocaine abuse, uncomplicated: Secondary | ICD-10-CM

## 2012-07-25 DIAGNOSIS — T43502A Poisoning by unspecified antipsychotics and neuroleptics, intentional self-harm, initial encounter: Secondary | ICD-10-CM | POA: Insufficient documentation

## 2012-07-25 DIAGNOSIS — M129 Arthropathy, unspecified: Secondary | ICD-10-CM | POA: Insufficient documentation

## 2012-07-25 DIAGNOSIS — F411 Generalized anxiety disorder: Secondary | ICD-10-CM | POA: Insufficient documentation

## 2012-07-25 DIAGNOSIS — I10 Essential (primary) hypertension: Secondary | ICD-10-CM | POA: Insufficient documentation

## 2012-07-25 LAB — CBC WITH DIFFERENTIAL/PLATELET
Basophils Absolute: 0.1 10*3/uL (ref 0.0–0.1)
Eosinophils Absolute: 0.2 10*3/uL (ref 0.0–0.7)
Eosinophils Relative: 2 % (ref 0–5)
Lymphs Abs: 2.8 10*3/uL (ref 0.7–4.0)
MCH: 32 pg (ref 26.0–34.0)
MCV: 91.2 fL (ref 78.0–100.0)
Neutrophils Relative %: 64 % (ref 43–77)
Platelets: 201 10*3/uL (ref 150–400)
RBC: 4.75 MIL/uL (ref 4.22–5.81)
RDW: 13.3 % (ref 11.5–15.5)
WBC: 10.3 10*3/uL (ref 4.0–10.5)

## 2012-07-25 LAB — POCT I-STAT, CHEM 8
BUN: 9 mg/dL (ref 6–23)
Chloride: 108 mEq/L (ref 96–112)
HCT: 44 % (ref 39.0–52.0)
Potassium: 3.4 mEq/L — ABNORMAL LOW (ref 3.5–5.1)
Sodium: 144 mEq/L (ref 135–145)

## 2012-07-25 LAB — ACETAMINOPHEN LEVEL: Acetaminophen (Tylenol), Serum: <15 ug/mL (ref 10–30)

## 2012-07-25 LAB — RAPID URINE DRUG SCREEN, HOSP PERFORMED
Cocaine: POSITIVE — AB
Opiates: NOT DETECTED
Tetrahydrocannabinol: NOT DETECTED

## 2012-07-25 LAB — SALICYLATE LEVEL: Salicylate Lvl: <2 mg/dL — ABNORMAL LOW (ref 2.8–20.0)

## 2012-07-25 NOTE — ED Provider Notes (Addendum)
Pt alert, content. Nad. Discussed w act team, eval/placement pending.   Arthur Roots, MD 07/25/12 0800   Telepsych/psychiatrist as reveresed ivc. States pt having no thoughts of harm to self or others, no psychosis, psych stable for d/c.   Recheck pt, pt alert, content. Normal mood/affect. States was 'stupid' earlier when intoxicated. He is oriented. Cooperative. Smiling.  States has good relationship w spouse, support at home.  States he will stop drinking on own, does not want inpatient rehab or detox. States feels ready and completely stable going home. Denies thoughts of harm to self or others.      Arthur Roots, MD 07/25/12 [phone]

## 2012-07-25 NOTE — ED Notes (Signed)
telepsych info faxed and called 

## 2012-07-25 NOTE — ED Notes (Signed)
D/C instructions given to wife with understanding stated. Denies pain. Ambulatory. Belongings returned. Escorted by staff out.

## 2012-07-25 NOTE — ED Notes (Signed)
Assisting with telepsych consult
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
## 2012-07-25 NOTE — ED Notes (Signed)
Pt ambulatory to dc window w/ /wife w/o difficulty, belongings returned after leaving unit

## 2012-07-25 NOTE — ED Notes (Signed)
Dr steinl into see 

## 2012-07-25 NOTE — ED Notes (Signed)
MD at bedside. 
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
## 2012-07-25 NOTE — ED Notes (Signed)
Belongings bag x 1 locked in locker #36.
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
## 2012-07-25 NOTE — ED Notes (Signed)
Pt's wife into see 

## 2012-07-25 NOTE — ED Notes (Signed)
ACT into see 

## 2012-07-25 NOTE — ED Notes (Signed)
Bed:WA08<BR> Expected date:<BR> Expected time:<BR> Means of arrival:<BR> Comments:<BR>

## 2012-07-25 NOTE — ED Notes (Signed)
Attempted to obtain urine specimen, pt stated he could not void at this time.
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
## 2012-07-25 NOTE — ED Notes (Signed)
Nursing assessment completed.

## 2012-07-26 NOTE — ED Provider Notes (Signed)
Medical screening examination/treatment/procedure(s) were performed by non-physician practitioner and as supervising physician I was immediately available for consultation/collaboration.    Euphemia Lingerfelt D Darthula Desa, MD 07/26/12 0140 

## 2012-08-13 ENCOUNTER — Encounter (HOSPITAL_COMMUNITY): Payer: Self-pay

## 2012-08-13 ENCOUNTER — Emergency Department (HOSPITAL_COMMUNITY): Payer: Medicaid Other

## 2012-08-13 ENCOUNTER — Emergency Department (HOSPITAL_COMMUNITY)
Admission: EM | Admit: 2012-08-13 | Discharge: 2012-08-13 | Disposition: A | Discharge status: Home / Self Care | Payer: Medicaid Other | Attending: Emergency Medicine | Admitting: Emergency Medicine

## 2012-08-13 DIAGNOSIS — S0993XA Unspecified injury of face, initial encounter: Secondary | ICD-10-CM | POA: Insufficient documentation

## 2012-08-13 DIAGNOSIS — Z8739 Personal history of other diseases of the musculoskeletal system and connective tissue: Secondary | ICD-10-CM | POA: Insufficient documentation

## 2012-08-13 DIAGNOSIS — Y929 Unspecified place or not applicable: Secondary | ICD-10-CM | POA: Insufficient documentation

## 2012-08-13 DIAGNOSIS — Y939 Activity, unspecified: Secondary | ICD-10-CM | POA: Insufficient documentation

## 2012-08-13 DIAGNOSIS — I10 Essential (primary) hypertension: Secondary | ICD-10-CM | POA: Insufficient documentation

## 2012-08-13 DIAGNOSIS — Z79899 Other long term (current) drug therapy: Secondary | ICD-10-CM | POA: Insufficient documentation

## 2012-08-13 DIAGNOSIS — G40909 Epilepsy, unspecified, not intractable, without status epilepticus: Secondary | ICD-10-CM | POA: Insufficient documentation

## 2012-08-13 DIAGNOSIS — B820 Intestinal helminthiasis, unspecified: Secondary | ICD-10-CM | POA: Insufficient documentation

## 2012-08-13 DIAGNOSIS — S0181XA Laceration without foreign body of other part of head, initial encounter: Secondary | ICD-10-CM

## 2012-08-13 DIAGNOSIS — M542 Cervicalgia: Secondary | ICD-10-CM

## 2012-08-13 DIAGNOSIS — Z8659 Personal history of other mental and behavioral disorders: Secondary | ICD-10-CM | POA: Insufficient documentation

## 2012-08-13 DIAGNOSIS — S0180XA Unspecified open wound of other part of head, initial encounter: Secondary | ICD-10-CM | POA: Insufficient documentation

## 2012-08-13 DIAGNOSIS — R296 Repeated falls: Secondary | ICD-10-CM | POA: Insufficient documentation

## 2012-08-13 DIAGNOSIS — R569 Unspecified convulsions: Secondary | ICD-10-CM

## 2012-08-13 DIAGNOSIS — Z23 Encounter for immunization: Secondary | ICD-10-CM | POA: Insufficient documentation

## 2012-08-13 DIAGNOSIS — F411 Generalized anxiety disorder: Secondary | ICD-10-CM | POA: Insufficient documentation

## 2012-08-13 DIAGNOSIS — F17200 Nicotine dependence, unspecified, uncomplicated: Secondary | ICD-10-CM | POA: Insufficient documentation

## 2012-08-13 LAB — COMPREHENSIVE METABOLIC PANEL
AST: 31 U/L (ref 0–37)
CO2: 23 mEq/L (ref 19–32)
Chloride: 100 mEq/L (ref 96–112)
Creatinine, Ser: 0.76 mg/dL (ref 0.50–1.35)
GFR calc Af Amer: >90 mL/min (ref >90)
GFR calc non Af Amer: >90 mL/min (ref >90)
Glucose, Bld: 114 mg/dL — ABNORMAL HIGH (ref 70–99)
Total Bilirubin: 0.4 mg/dL (ref 0.3–1.2)

## 2012-08-13 LAB — CBC WITH DIFFERENTIAL/PLATELET
Basophils Absolute: 0 10*3/uL (ref 0.0–0.1)
Eosinophils Relative: 1 % (ref 0–5)
HCT: 48.7 % (ref 39.0–52.0)
Hemoglobin: 17.5 g/dL — ABNORMAL HIGH (ref 13.0–17.0)
Lymphocytes Relative: 11 % — ABNORMAL LOW (ref 12–46)
Lymphs Abs: 1.6 10*3/uL (ref 0.7–4.0)
MCV: 90.4 fL (ref 78.0–100.0)
Monocytes Absolute: 1 10*3/uL (ref 0.1–1.0)
Monocytes Relative: 7 % (ref 3–12)
Neutro Abs: 11.3 10*3/uL — ABNORMAL HIGH (ref 1.7–7.7)
RBC: 5.39 MIL/uL (ref 4.22–5.81)
RDW: 12.6 % (ref 11.5–15.5)
WBC: 14.1 10*3/uL — ABNORMAL HIGH (ref 4.0–10.5)

## 2012-08-13 LAB — URINALYSIS, ROUTINE W REFLEX MICROSCOPIC
Bilirubin Urine: NEGATIVE
Nitrite: NEGATIVE
pH: 7 (ref 5.0–8.0)

## 2012-08-13 LAB — RAPID URINE DRUG SCREEN, HOSP PERFORMED
Barbiturates: NOT DETECTED
Opiates: NOT DETECTED
Tetrahydrocannabinol: NOT DETECTED

## 2012-08-13 MED ORDER — LEVETIRACETAM 500 MG PO TABS: 500.0000 mg | ORAL_TABLET | Freq: Two times a day (BID) | ORAL | Status: DC

## 2012-08-13 MED ORDER — SODIUM CHLORIDE 0.9 % IV BOLUS (SEPSIS)
1000.0000 mL | Freq: Once | INTRAVENOUS | Status: AC
  Administered 2012-08-13: 1000 mL via INTRAVENOUS

## 2012-08-13 MED ORDER — LEVETIRACETAM 500 MG PO TABS
500.0000 mg | ORAL_TABLET | Freq: Once | ORAL | Status: AC
  Administered 2012-08-13: 500 mg via ORAL
  Filled 2012-08-13: qty 1

## 2012-08-13 MED ORDER — TETANUS-DIPHTH-ACELL PERTUSSIS 5-2.5-18.5 LF-MCG/0.5 IM SUSP
0.5000 mL | Freq: Once | INTRAMUSCULAR | Status: AC
  Administered 2012-08-13: 0.5 mL via INTRAMUSCULAR
  Filled 2012-08-13: qty 0.5

## 2012-08-13 MED ORDER — HYDROCODONE-ACETAMINOPHEN 5-325 MG PO TABS: 2.0000 | ORAL_TABLET | ORAL | Status: DC | PRN
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
## 2012-08-13 NOTE — ED Notes (Signed)
Suture cart bedside. 
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
## 2012-08-13 NOTE — ED Provider Notes (Signed)
History     CSN: [id number]  Arrival date & time 08/13/12  1102   First MD Initiated Contact with Patient 08/13/12 1110      Chief Complaint  Patient presents with  . Seizures    (Consider location/radiation/quality/duration/timing/severity/associated sxs/prior treatment) HPI Comments: Patient with hx of seizures, HTN, depression and PTSD presents via EMS for seizure episode. Patient's stepson states the event occurred 45 min prior to arrival; states he was talking with his stepfather when his stepfather's eyes rolled back in his head and his arms and legs tensed in a flexed position. The patient fell to the ground hitting his chin and subsequently his head on the way to the ground. Per the stepson the event lasted approximately 6 minutes with a postictal phase of approximately 2 minutes. No loss of bladder or bowel function during the seizure episode. Denies recent alcohol excess or alcohol withdrawal as well as illicit drug use. Denies starting or stopping any medications recently.  Patient currently being followed by neurologist Dr. Terrace Arabia. An EEG has been done in the past which was found to be normal. Patient and family have been told that seizures are likely stress induced. Patient recently coping with the death of his sister. Wife states this has been leading the patient to take an overprescribed amount of his current medications, specifically xanax. The patient is not currently being treated with any anti-seizure medication. Patient has a FHx of epilepsy and seizure activity in 3 individuals in his family; more specifics unknown.  Patient is a 32 y.o. male presenting with seizures. The history is provided by the patient (the son). No language interpreter was used.  Seizures   Past Medical History  Diagnosis Date  . Seizures   . Hypertension   . Acid reflux   . Arthritis   . Anxiety   . PTSD (post-traumatic stress disorder)     Past Surgical History  Procedure Laterality Date  .  Knee surgery      No family history on file.  History  Substance Use Topics  . Smoking status: Current Every Day Smoker -- 2.00 packs/day for 16 years    Types: Cigarettes  . Smokeless tobacco: Former Neurosurgeon    Types: Chew    Quit date: 06/24/2011  . Alcohol Use: 1.2 oz/week    2 Cans of beer per week     Review of Systems  Constitutional: Negative for fever and chills.  HENT: Positive for neck pain. Negative for trouble swallowing.        +jaw pain  Eyes: Negative for photophobia and visual disturbance.  Respiratory: Negative for shortness of breath.   Cardiovascular: Negative for chest pain.  Genitourinary:       No loss of bowel or bladder function  Musculoskeletal: Negative for back pain.  Skin: Positive for wound.       4cm laceration on chin  Neurological: Positive for seizures. Negative for weakness and numbness.  All other systems reviewed and are negative.    Allergies  Tramadol  Home Medications   Current Outpatient Rx  Name  Route  Sig  Dispense  Refill  . ALPRAZolam (XANAX) 1 MG tablet   Oral   Take 1 mg by mouth 2 (two) times daily as needed. For anxiety         . carisoprodol (SOMA) 350 MG tablet   Oral   Take 350 mg by mouth 4 (four) times daily as needed. For muscle spasms         .  diphenhydrAMINE (BENADRYL) 25 mg capsule   Oral   Take 25 mg by mouth at bedtime as needed. For sleep         . zolpidem (AMBIEN) 5 MG tablet   Oral   Take 5 mg by mouth at bedtime as needed. For sleep         . HYDROcodone-acetaminophen (NORCO/VICODIN) 5-325 MG per tablet   Oral   Take 2 tablets by mouth every 4 (four) hours as needed for pain.   6 tablet   0   . levETIRAcetam (KEPPRA) 500 MG tablet   Oral   Take 1 tablet (500 mg total) by mouth 2 (two) times daily.   60 tablet   0     BP 122/75  Pulse 90  Temp(Src) 98.1 F (36.7 C) (Oral)  Resp 18  SpO2 98%  Physical Exam  Constitutional: He is oriented to person, place, and time. He  appears well-developed and well-nourished. No distress.  HENT:  Head: Normocephalic.    Eyes: Conjunctivae and EOM are normal. Pupils are equal, round, and reactive to light. Right eye exhibits no discharge. Left eye exhibits no discharge. No scleral icterus.  Neck: Normal range of motion. Neck supple.  Cardiovascular: Normal rate, regular rhythm, normal heart sounds and intact distal pulses.   Normal capillary refill  Pulmonary/Chest: Effort normal and breath sounds normal. No respiratory distress. He has no wheezes. He has no rales.  Abdominal: Soft. He exhibits no distension. There is no tenderness. There is no rebound.  Neurological: He is alert and oriented to person, place, and time. He has normal strength and normal reflexes. He displays no tremor. No cranial nerve deficit or sensory deficit. He exhibits normal muscle tone. GCS eye subscore is 4. GCS verbal subscore is 5. GCS motor subscore is 6.  Skin: Laceration noted. No bruising and no ecchymosis noted. He is not diaphoretic. No cyanosis or erythema. Nails show no clubbing.  4cm laceration on inferior chin secondary to fall after seizure onset. Bleeding controlled.  Psychiatric: He has a normal mood and affect. His behavior is normal.    ED Course  Procedures (including critical care time)  Labs Reviewed  CBC WITH DIFFERENTIAL - Abnormal; Notable for the following:    WBC 14.1 (*)    Hemoglobin 17.5 (*)    Neutrophils Relative 80 (*)    Neutro Abs 11.3 (*)    Lymphocytes Relative 11 (*)    All other components within normal limits  COMPREHENSIVE METABOLIC PANEL - Abnormal; Notable for the following:    Glucose, Bld 114 (*)    All other components within normal limits  URINALYSIS, ROUTINE W REFLEX MICROSCOPIC - Abnormal; Notable for the following:    Ketones, ur TRACE (*)    All other components within normal limits  ETHANOL  URINE RAPID DRUG SCREEN (HOSP PERFORMED)   Ct Head Wo Contrast  08/13/2012  *RADIOLOGY REPORT*   Clinical Data: Seizure.  CT HEAD WITHOUT CONTRAST  Technique:  Contiguous axial images were obtained from the base of the skull through the vertex without contrast.  Comparison: CT head without contrast 07/25/2012.  Findings: No acute intracranial abnormality is present. Specifically, there is no evidence for acute infarct, hemorrhage, mass, hydrocephalus, or extra-axial fluid collection.  The paranasal sinuses and mastoid air cells are clear.  The globes and orbits are intact.  The osseous skull is intact.  IMPRESSION: Negative CT of the head.   Original Report Authenticated By: Marin Roberts, M.D.  Ct Cervical Spine Wo Contrast  08/13/2012  *RADIOLOGY REPORT*  Clinical Data: Seizures.  CT CERVICAL SPINE WITHOUT CONTRAST  Technique:  Multidetector CT imaging of the cervical spine was performed. Multiplanar CT image reconstructions were also generated.  Comparison: CT of the cervical spine 07/25/2012.  Chest x-ray 07/12/2010.  Findings: The cervical spine is imaged from skull base through C7- T1.  The vertebral body heights and alignment maintained.  The soft tissues are unremarkable.  No acute fracture or traumatic subluxation is evident.  Bullous changes are noted at the lung apices bilaterally.  IMPRESSION:  1.  Normal CT appearance of the cervical spine. 2.  Bullous changes or emphysema at the lung apices bilaterally.   Original Report Authenticated By: Marin Roberts, M.D.    LACERATION REPAIR Performed by: Antony Madura Authorized by: Antony Madura Consent: Verbal consent obtained. Risks and benefits: risks, benefits and alternatives were discussed Consent given by: patient Patient identity confirmed: provided demographic data Prepped and Draped in normal sterile fashion Wound explored  Laceration Location: interior chin  Laceration Length: 4cm  No Foreign Bodies seen or palpated  Anesthesia: local infiltration  Local anesthetic: lidocaine 2% without epinephrine  Anesthetic  total: 3 ml  Irrigation method: syringe Amount of cleaning: standard  Skin closure: 5-0 prolene  Number of sutures: 3  Technique: simple interrupted  Patient tolerance: Patient tolerated the procedure well with no immediate complications.   1. Seizure   2. Laceration of chin without complication   3. Neck pain, musculoskeletal      MDM  Patient with hx of seizure activity presents for a tonic-clonic type seizure episode which caused him to fall to the floor, hitting his chin and head on the way to the ground. Patient has 4cm laceration on chin on arrival. Doreene Adas states the episode lasted 6 minutes with a postictal phase of 2 minutes. Patient is being followed by Dr. Terrace Arabia of neurology who has not yet determined the cause of the patient's seizure activity; recent EEG was normal. Patient not currently on any seizure medication.   CT head s contrast done in light of patient's fall and head trauma which showed no evidence of hemorrhage, mass, hydrocephalus, or skull fracture. CT C-spine s contrast also showed no acute abnormalities. Work up included labs, which was positive for a leukocytosis, and an ethanol level and urine drug screen which were both negative. Management in ED today included Keppra 500mg  PO as well as laceration repair of the patient's chin. Patient states he is back at his baseline; he is well-appearing, nontoxic and his vitals are stable. Based on the history the patient seems to be having actual seizure episodes rather than pseudoseizures which was being considered as a diagnosis by his neurologist. The patient will be given a script for Keppra 500mg  BID at discharge with instruction to follow up with his neurologist. He has also been verbally instructed to return for suture removal in 5 days. Patient has verbalized understanding and his comfort with this plan. I have discussed the patient history, work up and management with Dr. Juleen China who is in agreement.  Filed Vitals:    08/13/12 1118 08/13/12 1549 08/13/12 1658  BP: 128/85 122/75 122/75  Pulse: 105 97 90  Temp: 97.7 F (36.5 C) 98.1 F (36.7 C)   TempSrc: Oral Oral   Resp: 22 12 18   SpO2: 94% 100% 98%          Antony Madura, PA-C 08/13/12 1858

## 2012-08-16 NOTE — ED Provider Notes (Signed)
Medical screening examination/treatment/procedure(s) were conducted as a shared visit with non-physician practitioner(s) and myself.  I personally evaluated the patient during the encounter.  31yM with symptoms consistent w/ seizure. Now back to baseline per wife at bedside. Nonfocal neuro exam. W/u fairly unremarkable. Chin laceration which closed by Antony Madura, PA. Pt giving hx of having these events every couple weeks and not on antiepileptic. Has neurologist whom I tried to discuss with prior to DC w/o success. Will start on keppra. Discussed need to follow-up with Dr Terrace Arabia ASAP.  Raeford Razor, MD 08/16/12 [phone]

## 2012-09-13 ENCOUNTER — Telehealth: Payer: Self-pay

## 2012-09-13 NOTE — Telephone Encounter (Signed)
Patient wants to do seizure study. Can Dr.Yan arrange this for me she talked to me at appointment  About this but I was not sure I wanted to do this. I would like to get started now.

## 2012-09-13 NOTE — Telephone Encounter (Signed)
Arthur Baird, please help on his questions. Arthur Baird

## 2012-10-27 ENCOUNTER — Telehealth: Payer: Self-pay

## 2012-10-27 NOTE — Telephone Encounter (Signed)
I called left messages asking them to call.back. Need more info about what patient is needing.

## 2012-10-28 ENCOUNTER — Telehealth: Payer: Self-pay | Admitting: Neurology

## 2012-10-28 NOTE — Telephone Encounter (Signed)
wife returning call about pt twitching-please call again.

## 2012-11-01 NOTE — Telephone Encounter (Signed)
Called and left another message

## 2012-11-01 NOTE — Telephone Encounter (Signed)
Called and left message asking wife to call back.

## 2012-11-01 NOTE — Telephone Encounter (Signed)
  Arthur Baird patients wife calling. Patient wife is worried saying her husband keeps having Seizures. Patient has apt with Eber Jones 11/17/2102. Patient has tried to quit tobacco and watch his eating also. Still having SZ.

## 2012-11-01 NOTE — Telephone Encounter (Signed)
I have called him, he is taking keppra 500mg  bid, he has no recurrent seizure, he complains of dizziness, GI upset, at night, he has body twitching episode.  He had 3 seizures in one day, 2 days ago.  1. Keep follow up. 2. Keep keppra 500mg  bid

## 2012-11-16 ENCOUNTER — Ambulatory Visit: Payer: Self-pay | Admitting: Nurse Practitioner

## 2013-02-11 ENCOUNTER — Encounter (HOSPITAL_COMMUNITY): Payer: Self-pay | Admitting: *Deleted

## 2013-02-11 ENCOUNTER — Emergency Department (HOSPITAL_COMMUNITY): Payer: Medicaid Other

## 2013-02-11 ENCOUNTER — Emergency Department (HOSPITAL_COMMUNITY)
Admission: EM | Admit: 2013-02-11 | Discharge: 2013-02-11 | Disposition: A | Discharge status: Home / Self Care | Payer: Medicaid Other | Attending: Emergency Medicine | Admitting: Emergency Medicine

## 2013-02-11 DIAGNOSIS — R569 Unspecified convulsions: Secondary | ICD-10-CM

## 2013-02-11 DIAGNOSIS — Y9389 Activity, other specified: Secondary | ICD-10-CM | POA: Insufficient documentation

## 2013-02-11 DIAGNOSIS — Z8659 Personal history of other mental and behavioral disorders: Secondary | ICD-10-CM | POA: Insufficient documentation

## 2013-02-11 DIAGNOSIS — Y92009 Unspecified place in unspecified non-institutional (private) residence as the place of occurrence of the external cause: Secondary | ICD-10-CM | POA: Insufficient documentation

## 2013-02-11 DIAGNOSIS — S0003XA Contusion of scalp, initial encounter: Secondary | ICD-10-CM | POA: Insufficient documentation

## 2013-02-11 DIAGNOSIS — F17200 Nicotine dependence, unspecified, uncomplicated: Secondary | ICD-10-CM | POA: Insufficient documentation

## 2013-02-11 DIAGNOSIS — Z8719 Personal history of other diseases of the digestive system: Secondary | ICD-10-CM | POA: Insufficient documentation

## 2013-02-11 DIAGNOSIS — G40909 Epilepsy, unspecified, not intractable, without status epilepticus: Secondary | ICD-10-CM | POA: Insufficient documentation

## 2013-02-11 DIAGNOSIS — S0033XA Contusion of nose, initial encounter: Secondary | ICD-10-CM

## 2013-02-11 DIAGNOSIS — S0990XA Unspecified injury of head, initial encounter: Secondary | ICD-10-CM

## 2013-02-11 DIAGNOSIS — I10 Essential (primary) hypertension: Secondary | ICD-10-CM | POA: Insufficient documentation

## 2013-02-11 DIAGNOSIS — W1789XA Other fall from one level to another, initial encounter: Secondary | ICD-10-CM | POA: Insufficient documentation

## 2013-02-11 DIAGNOSIS — R55 Syncope and collapse: Secondary | ICD-10-CM | POA: Insufficient documentation

## 2013-02-11 DIAGNOSIS — Z8739 Personal history of other diseases of the musculoskeletal system and connective tissue: Secondary | ICD-10-CM | POA: Insufficient documentation

## 2013-02-11 DIAGNOSIS — S01511A Laceration without foreign body of lip, initial encounter: Secondary | ICD-10-CM

## 2013-02-11 DIAGNOSIS — S01501A Unspecified open wound of lip, initial encounter: Secondary | ICD-10-CM | POA: Insufficient documentation

## 2013-02-11 LAB — BASIC METABOLIC PANEL
BUN: 22 mg/dL (ref 6–23)
Calcium: 9.4 mg/dL (ref 8.4–10.5)
GFR calc non Af Amer: 80 mL/min — ABNORMAL LOW (ref >90)
Glucose, Bld: 104 mg/dL — ABNORMAL HIGH (ref 70–99)

## 2013-02-11 LAB — CBC WITH DIFFERENTIAL/PLATELET
Eosinophils Absolute: 0.2 10*3/uL (ref 0.0–0.7)
Eosinophils Relative: 1 % (ref 0–5)
Hemoglobin: 16.2 g/dL (ref 13.0–17.0)
Lymphs Abs: 2.1 10*3/uL (ref 0.7–4.0)
MCH: 33.3 pg (ref 26.0–34.0)
MCV: 90.3 fL (ref 78.0–100.0)
Monocytes Relative: 14 % — ABNORMAL HIGH (ref 3–12)
RBC: 4.86 MIL/uL (ref 4.22–5.81)

## 2013-02-11 MED ORDER — SODIUM CHLORIDE 0.9 % IV SOLN
1000.0000 mg | Freq: Once | INTRAVENOUS | Status: AC
  Administered 2013-02-11: 1000 mg via INTRAVENOUS
  Filled 2013-02-11: qty 20

## 2013-02-11 MED ORDER — LIDOCAINE-EPINEPHRINE 1 %-1:100000 IJ SOLN: 10.0000 mL | Freq: Once | INTRAMUSCULAR | Status: DC

## 2013-02-11 MED ORDER — PHENYTOIN SODIUM EXTENDED 300 MG PO CAPS: 300.0000 mg | ORAL_CAPSULE | Freq: Every day | ORAL | Status: DC

## 2013-02-11 MED ORDER — SODIUM CHLORIDE 0.9 % IV BOLUS (SEPSIS)
1000.0000 mL | Freq: Once | INTRAVENOUS | Status: AC
  Administered 2013-02-11: 1000 mL via INTRAVENOUS

## 2013-02-11 NOTE — ED Provider Notes (Signed)
CSN: [id number]     Arrival date & time 02/11/13  1533 History     First MD Initiated Contact with Patient 02/11/13 1539     Chief Complaint  Patient presents with  . Loss of Consciousness   (Consider location/radiation/quality/duration/timing/severity/associated sxs/prior Treatment) Patient is a 32 y.o. male presenting with syncope. The history is provided by the patient and the EMS personnel.  Loss of Consciousness Episode history:  Single Most recent episode:  Today Timing:  Rare Progression:  Resolved Chronicity:  New Witnessed: yes   Relieved by:  Lying down Exacerbated by: being overheated and exercise. Ineffective treatments:  None tried Associated symptoms: no chest pain, no confusion, no fever, no headaches, no nausea, no shortness of breath, no vomiting and no weakness     Past Medical History  Diagnosis Date  . Seizures   . Hypertension   . Acid reflux   . Arthritis   . Anxiety   . PTSD (post-traumatic stress disorder)    Past Surgical History  Procedure Laterality Date  . Knee surgery     History reviewed. No pertinent family history. History  Substance Use Topics  . Smoking status: Current Every Day Smoker -- 2.00 packs/day for 16 years    Types: Cigarettes  . Smokeless tobacco: Former Neurosurgeon    Types: Chew    Quit date: 06/24/2011  . Alcohol Use: 1.2 oz/week    2 Cans of beer per week    Review of Systems  Constitutional: Negative for fever, activity change, appetite change and fatigue.  HENT: Negative for congestion, sore throat, facial swelling, rhinorrhea, trouble swallowing, neck pain, neck stiffness, voice change and sinus pressure.   Eyes: Negative.   Respiratory: Negative for cough, choking, chest tightness, shortness of breath and wheezing.   Cardiovascular: Positive for syncope. Negative for chest pain.  Gastrointestinal: Negative for nausea, vomiting and abdominal pain.  Genitourinary: Negative for dysuria, urgency, frequency, hematuria,  flank pain and difficulty urinating.  Musculoskeletal: Negative for back pain and gait problem.  Skin: Positive for wound. Negative for rash.  Neurological: Positive for syncope. Negative for facial asymmetry, weakness, numbness and headaches.  Psychiatric/Behavioral: Negative for behavioral problems, confusion and agitation. The patient is not nervous/anxious and is not hyperactive.   All other systems reviewed and are negative.    Allergies  Tramadol  Home Medications   Current Outpatient Rx  Name  Route  Sig  Dispense  Refill  . neomycin-bacitracin-polymyxin (NEOSPORIN) ointment   Topical   Apply 1 application topically daily as needed (for rash). apply to eye         . phenytoin (DILANTIN) 300 MG ER capsule   Oral   Take 1 capsule (300 mg total) by mouth daily.   30 capsule   0    BP 137/85  Pulse 116  Temp(Src) 98.1 F (36.7 C)  Resp 16  Ht 5\' 10"  (1.778 m)  Wt 145 lb (65.772 kg)  BMI 20.81 kg/m2  SpO2 100% Physical Exam  Nursing note and vitals reviewed. Constitutional: He is oriented to person, place, and time. He appears well-developed and well-nourished. No distress.  HENT:  Head: Normocephalic. Head is with laceration.    Right Ear: External ear normal.  Left Ear: External ear normal.  Mouth/Throat: No oropharyngeal exudate.  Laceration to the area noted.  Patient with abrasion to the bottom lip  Eyes: Conjunctivae and EOM are normal. Pupils are equal, round, and reactive to light. Right eye exhibits no discharge. Left eye  exhibits no discharge.  Neck: Normal range of motion. Neck supple. No JVD present. No spinous process tenderness and no muscular tenderness present. No tracheal deviation present. No thyromegaly present.  Cardiovascular: Normal rate, regular rhythm, normal heart sounds and intact distal pulses.  Exam reveals no gallop and no friction rub.   No murmur heard. Pulmonary/Chest: Effort normal and breath sounds normal. No respiratory  distress. He has no wheezes. He exhibits no tenderness.  Abdominal: Soft. Bowel sounds are normal. He exhibits no distension. There is no tenderness. There is no rebound and no guarding.  Musculoskeletal: Normal range of motion. He exhibits no edema and no tenderness.  Lymphadenopathy:    He has no cervical adenopathy.  Neurological: He is alert and oriented to person, place, and time. He has normal strength. No cranial nerve deficit or sensory deficit. He displays a negative Romberg sign. Coordination and gait normal.  Skin: Skin is warm and dry. No rash noted. He is not diaphoretic. No pallor.  Psychiatric: He has a normal mood and affect. His behavior is normal.    ED Course   Wound repair Date/Time: 02/11/2013 6:54 PM Performed by: Sherryl Manges Authorized by: Billee Cashing. Consent: Verbal consent obtained. Risks and benefits: risks, benefits and alternatives were discussed Consent given by: patient Patient understanding: patient states understanding of the procedure being performed Patient consent: the patient's understanding of the procedure matches consent given Procedure consent: procedure consent matches procedure scheduled Patient identity confirmed: arm band and hospital-assigned identification number Time out: Immediately prior to procedure a "time out" was called to verify the correct patient, procedure, equipment, support staff and site/side marked as required. Preparation: Patient was prepped and draped in the usual sterile fashion. Local anesthesia used: yes Anesthesia: local infiltration Local anesthetic: lidocaine 2% without epinephrine Anesthetic total: 2 ml Patient sedated: no Patient tolerance: Patient tolerated the procedure well with no immediate complications. Comments: Patient with 2 cm laceration to the lip that involves the left nares. Also 0.5 cm  Laceration to the lip that involves the vermillion border. No tendon, nerve, artery involvement. Repaired  with 6-0 plain gut. No complications   (including critical care time)  Labs Reviewed  CBC WITH DIFFERENTIAL - Abnormal; Notable for the following:    WBC 17.4 (*)    MCHC 36.9 (*)    Neutro Abs 12.7 (*)    Monocytes Relative 14 (*)    Monocytes Absolute 2.4 (*)    All other components within normal limits  BASIC METABOLIC PANEL - Abnormal; Notable for the following:    Glucose, Bld 104 (*)    GFR calc non Af Amer 80 (*)    All other components within normal limits  CK  URINALYSIS, ROUTINE W REFLEX MICROSCOPIC   Ct Head Wo Contrast  02/11/2013   *RADIOLOGY REPORT*  Clinical Data: Loss of consciousness.  CT HEAD WITHOUT CONTRAST  Technique:  Contiguous axial images were obtained from the base of the skull through the vertex without contrast.  Comparison: 08/13/2012  Findings: No evidence for acute hemorrhage, midline shift, hydrocephalus or large infarct.  Again noted is a CSF attenuating structure/area in the left middle cranial fossa.  This probably represents a small arachnoid cyst and unchanged since 07/08/2011. Mild mucosal disease in left posterior ethmoid air cells.  Question mildly displaced left nasal bone fractures of unknown age.  Impression:  No acute intracranial abnormality.  Probable arachnoid cyst that is stable.  Question minimally displaced left nasal bone fractures of unknown age.  Original Report Authenticated By: Richarda Overlie, M.D.   1. Seizure   2. Lip laceration, initial encounter   3. Closed head injury, initial encounter   4. Contusion, nose, initial encounter     MDM  32 yr old M patient here after having possible syncopal episode. Patient says he has a hx of seizures and stopped taking his keppra. Patient says he was out in the yard working in the heat and the next think he remembers is being on the ground. He is not sure if he had syncope from being overheated and dehydrated, had a seizure, and possibly thinks he could have been electrocuted perhaps clipping a  wire while clipping hedges. Doubt electrocution as patient has no injuries other than bruising to the bridge of his nose and a lip lac. Will get Ct with this episode and check labs. Will also get EKG.   Date: 02/11/2013  Rate: 113  Rhythm: sinus tachycardia  QRS Axis: normal  Intervals: normal  ST/T Wave abnormalities: normal  Conduction Disutrbances:none  Narrative Interpretation:   Old EKG Reviewed: none available  Patient lip repaired, normal head CT and reassuring labs. No evidence of electrical injury. After speaking with father who is now at bedside he is describing a seizure episode with a post-ictal period. Patient says he cannot tolerate his normal keppra. Will load him with dilantin and then prescribe it to him with close follow up with neurology recommended for his seizures.  Case discussed with Dr. Benjamin Stain, MD 02/11/13 [phone]

## 2013-02-11 NOTE — ED Notes (Signed)
The pt arrived by gems from home where he  Was doing yard work and felt a shock he then fell off a fence.   He cannot remember the fall.  He was daignosed with seizures last month.  His father thinks that the pt had a seizure.  No one saw any jerking .  When ems arived the pt was not confused but could not remember what occurred.  Alert iv per ems.  C/o some neck pain

## 2013-02-12 NOTE — ED Provider Notes (Signed)
I saw and evaluated the patient, reviewed the resident's note and I agree with the findings and plan and agree with their ECG interpretation. Patient with likely seizure. His been off his medication. Patient was loaded on Dilantin. Laceration was closed by Dr. Lew Dawes under my supervision.   Arthur Baird. Rubin Payor, MD 02/12/13 [phone]

## 2013-02-18 ENCOUNTER — Telehealth: Payer: Self-pay | Admitting: *Deleted

## 2013-02-18 ENCOUNTER — Encounter: Payer: Self-pay | Admitting: Nurse Practitioner

## 2013-02-18 ENCOUNTER — Ambulatory Visit (INDEPENDENT_AMBULATORY_CARE_PROVIDER_SITE_OTHER): Payer: Medicaid Other | Admitting: Nurse Practitioner

## 2013-02-18 VITALS — BP 113/77 | HR 106 | Ht 71.0 in | Wt 153.0 lb

## 2013-02-18 DIAGNOSIS — F4310 Post-traumatic stress disorder, unspecified: Secondary | ICD-10-CM

## 2013-02-18 DIAGNOSIS — G40309 Generalized idiopathic epilepsy and epileptic syndromes, not intractable, without status epilepticus: Secondary | ICD-10-CM

## 2013-02-18 DIAGNOSIS — R569 Unspecified convulsions: Secondary | ICD-10-CM

## 2013-02-18 DIAGNOSIS — G40909 Epilepsy, unspecified, not intractable, without status epilepticus: Secondary | ICD-10-CM

## 2013-02-18 DIAGNOSIS — Z79899 Other long term (current) drug therapy: Secondary | ICD-10-CM

## 2013-02-18 NOTE — Progress Notes (Signed)
Reason for visit  followup for seizure disorder HPI: Arthur Baird, 32 year old male returns for follow up. Last seen 08/17/2012 for  seizure disorder.  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 It often trigged by home visit of his probation officer  Per his wife, each episode is different, he had extensive evaluation, included CT of the brain,  that was normal, he cannot have MRI cause metal pieces in his body. EEG was reported normal. He had multiple emergency visit, most recent one was in March 28, he also complained of chest pain, CT angiogram of chest showed no evidence of PE, evidence of moderate COPD   02/18/13: Patient returns for followup. When last seen 08/17/2012 patient was on Keppra for his seizure disorder . He claims he was having side effects of the medication but he did not call her office to report those. Apparently he was having increased headaches. He stopped the medication. He was then seen in the emergency room on 02/11/13 for witnessed generalized seizure . CT of the brain without anything acute.He was given IV Dilantin and given a prescription for Dilantin extended release 300 daily. He also suffers from PTSD and goes to [facility] however he is inconsistent with followup.    ROS:  14 system review of symptoms, the patient complains only of the following and all other review of systems are negative Fatigue, chest pain, blurred vision, feeling hot, joint pain, memory loss, confusion, and headache  seizure, anxiety, decreased energy, racing thoughts sleepiness   Medications Current Outpatient Prescriptions on File Prior to Visit  Medication Sig Dispense Refill  . neomycin-bacitracin-polymyxin (NEOSPORIN) ointment Apply 1 application topically daily as needed (for rash). apply to eye      . phenytoin (DILANTIN) 300 MG ER capsule Take 1 capsule (300 mg total) by mouth daily.  30 capsule  0   No current facility-administered medications on file prior to visit.    Allergies  Allergies  Allergen Reactions  . Tramadol Other (See Comments)    Can't pee when taking medication    Physical Exam General: well developed, well nourished, seated, in no evident distress Head: head normocephalic and atraumatic. Oropharynx benign Neck: supple with no carotid  bruits Cardiovascular: regular rate and rhythm, no murmurs  Neurologic Exam Mental Status: Awake and fully alert. Oriented to place and time. Follows all commands. Speech and language normal.   Cranial Nerves:  Pupils equal, briskly reactive to light. Extraocular movements full without nystagmus. Visual fields full to confrontation. Hearing intact and symmetric to finger snap. Facial sensation intact. Face, tongue, palate move normally and symmetrically. Neck flexion and extension normal.  Motor: Normal bulk and tone. Normal strength in all tested extremity muscles.No focal weakness Coordination: Rapid alternating movements normal in all extremities. Finger-to-nose and heel-to-shin performed accurately bilaterally. No dysmetria Gait and Station: Arises from chair without difficulty. Stance is normal. Gait demonstrates normal stride length and balance . Able to heel, toe and tandem walk without difficulty.  Reflexes: 2+ and symmetric. Toes  downgoing.     ASSESSMENT: 32 year old with PTSD, seizure-like activity normal neurologic exam. Was on Keppra when last seen but stopped the medication. ER visit 02/11/13 for witnessed seizure and  placed on Dilantin . CBC and CMP within normal limits    PLAN: Pt to continue Dilantin at current dose Dilantin level today No driving for 6 months F/U in 6 months Anxiety depression, PTSD  to be treated by psych at [facility], GNP-BC APRN

## 2013-02-18 NOTE — Telephone Encounter (Signed)
Patients wife called to ask if someone could please call her. She could not make his appointment with him today, but when he returned home he told her that his seizure meds were deceased. This concerns her because the patient has been in an out of the ER so much everyone there knows him by name. His seizures last for approximately 4 mins, and he bleeds from his nose and ears as a result of it.  She states the ER doctors placed him on the seizure meds at the current dose, and it was deceased today and she wants an explanation as to why, if it is controlling the seizures.   She is also requesting to change physicians.

## 2013-02-18 NOTE — Patient Instructions (Addendum)
Pt to continue Dilantin at current dose Dilantin level today No driving for 6 months F/U in 6 months Anxiety depression, PTSD  to be treated by psych at [facility]

## 2013-02-22 ENCOUNTER — Other Ambulatory Visit: Payer: Self-pay | Admitting: Nurse Practitioner

## 2013-02-22 ENCOUNTER — Telehealth: Payer: Self-pay | Admitting: Nurse Practitioner

## 2013-02-22 DIAGNOSIS — Z79899 Other long term (current) drug therapy: Secondary | ICD-10-CM

## 2013-02-22 DIAGNOSIS — G40909 Epilepsy, unspecified, not intractable, without status epilepticus: Secondary | ICD-10-CM

## 2013-02-22 MED ORDER — PHENYTOIN SODIUM EXTENDED 100 MG PO CAPS: 100.0000 mg | ORAL_CAPSULE | Freq: Every day | ORAL | Status: DC

## 2013-02-22 MED ORDER — PHENYTOIN SODIUM EXTENDED 100 MG PO CAPS: 400.0000 mg | ORAL_CAPSULE | Freq: Every day | ORAL | Status: DC

## 2013-02-22 NOTE — Telephone Encounter (Signed)
I called and left wife a VM that medication was called in 100 mg capsules enough to cover 4 capsules per day. Please be certain to pick up correct prescritpion. Also, a reminder, this is an increase to 400 mg, from 300 mg per day.

## 2013-02-22 NOTE — Telephone Encounter (Signed)
I also discussed medication dosing with patient's wife. Medication was not decreased. Dilantin level was slightly low. 100 mg capsules were called into the pharmacy because patient was to add this to the 300 mg capsules he already had to make it 400 mg dilantin a day. Wife stated that the 300 mg capsules are on back order so they are using the 100 mg capsules. She is requesting that another order be placed to assure the patient has enough 100 mg capsules for the month.

## 2013-02-22 NOTE — Telephone Encounter (Signed)
Will reorder the 100mg  Dilantin caps needs 4/day.

## 2013-02-22 NOTE — Addendum Note (Signed)
Addended by: Beverely Low on: 02/22/2013 01:43 PM   Modules accepted: Orders

## 2013-02-22 NOTE — Telephone Encounter (Signed)
Vallerie, please make sure wife knows new RX just sent. Need to make sure she picks up the correct one.

## 2013-02-22 NOTE — Telephone Encounter (Signed)
Patient has an attenuating structure/area in the left middle cranial fossa. Wife is wondering if this is causing his seizures.   Wife wants MD to know that patient will have episode of paranoia and then will have seizure.   Patient feels he needs to have a physician who is clear and definitive.  I let wife know we will work on this.

## 2013-02-23 NOTE — Telephone Encounter (Signed)
The order for the lab is already in the system, do not need an appt,take the Dilantin the previous night as ordered. 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

## 2013-02-23 NOTE — Telephone Encounter (Signed)
Discussed with Dr. Terrace Arabia, medication was increased not decreased.

## 2013-02-23 NOTE — Telephone Encounter (Signed)
I spoke to wife and relayed Carolyn's message.  I told her that he can have his Dilantin levels drawn here, but not to take his Dilantin that morning.  She understood.  She is very focused on having him seizure free.

## 2013-02-26 ENCOUNTER — Inpatient Hospital Stay (HOSPITAL_COMMUNITY)
Admission: EM | Admit: 2013-02-26 | Discharge: 2013-02-27 | DRG: 101 | Disposition: A | Discharge status: Home / Self Care | Payer: Medicaid Other | Attending: Internal Medicine | Admitting: Internal Medicine

## 2013-02-26 ENCOUNTER — Encounter (HOSPITAL_COMMUNITY): Payer: Self-pay | Admitting: Emergency Medicine

## 2013-02-26 DIAGNOSIS — R4182 Altered mental status, unspecified: Secondary | ICD-10-CM

## 2013-02-26 DIAGNOSIS — F1910 Other psychoactive substance abuse, uncomplicated: Secondary | ICD-10-CM | POA: Diagnosis present

## 2013-02-26 DIAGNOSIS — I10 Essential (primary) hypertension: Secondary | ICD-10-CM | POA: Diagnosis present

## 2013-02-26 DIAGNOSIS — F1210 Cannabis abuse, uncomplicated: Secondary | ICD-10-CM | POA: Diagnosis present

## 2013-02-26 DIAGNOSIS — F4310 Post-traumatic stress disorder, unspecified: Secondary | ICD-10-CM | POA: Diagnosis present

## 2013-02-26 DIAGNOSIS — G40401 Other generalized epilepsy and epileptic syndromes, not intractable, with status epilepticus: Principal | ICD-10-CM | POA: Diagnosis present

## 2013-02-26 DIAGNOSIS — F1410 Cocaine abuse, uncomplicated: Secondary | ICD-10-CM

## 2013-02-26 DIAGNOSIS — G9340 Encephalopathy, unspecified: Secondary | ICD-10-CM

## 2013-02-26 DIAGNOSIS — M129 Arthropathy, unspecified: Secondary | ICD-10-CM | POA: Diagnosis present

## 2013-02-26 DIAGNOSIS — G40901 Epilepsy, unspecified, not intractable, with status epilepticus: Secondary | ICD-10-CM

## 2013-02-26 DIAGNOSIS — F411 Generalized anxiety disorder: Secondary | ICD-10-CM | POA: Diagnosis present

## 2013-02-26 DIAGNOSIS — F17200 Nicotine dependence, unspecified, uncomplicated: Secondary | ICD-10-CM | POA: Diagnosis present

## 2013-02-26 DIAGNOSIS — Z87820 Personal history of traumatic brain injury: Secondary | ICD-10-CM

## 2013-02-26 MED ORDER — LORAZEPAM 2 MG/ML IJ SOLN
2.0000 mg | Freq: Once | INTRAMUSCULAR | Status: AC
  Administered 2013-02-26: 2 mg via INTRAVENOUS

## 2013-02-26 MED ORDER — SODIUM CHLORIDE 0.9 % IV BOLUS (SEPSIS)
1000.0000 mL | Freq: Once | INTRAVENOUS | Status: AC
  Administered 2013-02-27: 1000 mL via INTRAVENOUS

## 2013-02-26 NOTE — ED Provider Notes (Addendum)
CSN: [id number]     Arrival date & time 02/26/13  2311 History   First MD Initiated Contact with Patient 02/26/13 2335     Chief Complaint  Patient presents with  . 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 The patient's wife gives hx. She says that the patient developed seizure at approximately 2200h. He had several back to back seizures and has not regained a normal LOC.  Wife notes that the patient was most recently seen in ED approximately 10 days ago. At that time, he was taken off Keppra in favor of Dilantin. Wife reports that the patient has been compliant with Dilantin ER 400mg  qam. He was last seen at the office of his neurologist, Dr. Debarah Crape, and seen by an [facility].    Wife says that the patient appeared cyanotic at one point during the seizure activity. His seizures occurred back to back for approximately 5m. Wife removed the patient's dentures, turned his head to the side and did not witness aspiration. Wife witnessed the entire event and says that the patient did not sustain any head trauma during the seizure activity.   Patient has been in usual state of health since he was TAR from the ED about 10d ago. Normal po intake. No symptoms of systemic illness.   Past Medical History  Diagnosis Date  . Seizures   . Hypertension   . Acid reflux   . Arthritis   . Anxiety   . PTSD (post-traumatic stress disorder)    Past Surgical History  Procedure Laterality Date  . Knee surgery     No family history on file. History  Substance Use Topics  . Smoking status: Current Every Day Smoker -- 2.00 packs/day for 16 years    Types: Cigarettes  . Smokeless tobacco: Former Neurosurgeon    Types: Chew    Quit date: 06/24/2011  . Alcohol Use: 1.2 oz/week    2 Cans of beer per week   Comment: per week     Review of Systems Unable to obtain from the patient due to AMS - please note that level V caveat automatically applies.     Allergies  Tramadol  Home Medications   Current Outpatient Rx  Name  Route  Sig  Dispense  Refill  . neomycin-bacitracin-polymyxin (NEOSPORIN) ointment   Topical   Apply 1 application topically daily as needed (for rash). apply to eye         . phenytoin (DILANTIN) 100 MG ER capsule   Oral   Take 4 capsules (400 mg total) by mouth at bedtime.   120 capsule   4    BP 107/91  Pulse 85  Temp(Src) 97.4 F (36.3 C) (Oral)  Resp 24  SpO2 99% Physical Exam Gen: well developed and well nourished appearing, obtunded Head: NCAT Eyes: PERL, EOMI Nose: no epistaixis or rhinorrhea Mouth/throat: mucosa is moist and pink, weak gag reflex, no signs of intraoral trauma Neck: supple, no stridor Lungs: CTA B, no wheezing, rhonchi or rales, RR 24/min Abd: soft, notender, nondistended Back: no ttp, no cva ttp Skin: no rashese, wnl, no signs of trauma Neuro: obtunded with PERLA, does not follow commands, eyes do not track, patient withdraws extremities to noxious commands.  Psyche; patient obtunded   ED Course  Procedures (including critical care time)  Results for orders placed during the hospital encounter of 02/26/13 (from the past 24 hour(s))  COMPREHENSIVE METABOLIC PANEL     Status: Abnormal   Collection Time    02/27/13  1:53 AM      Result Value Range   Sodium 140  135 - 145 mEq/L   Potassium 3.8  3.5 - 5.1 mEq/L   Chloride 106  96 - 112 mEq/L   CO2 26  19 - 32 mEq/L   Glucose, Bld 72  70 - 99 mg/dL   BUN 16  6 - 23 mg/dL   Creatinine, Ser 1.61  0.50 - 1.35 mg/dL   Calcium 8.3 (*) 8.4 - 10.5 mg/dL   Total Protein 6.1  6.0 - 8.3 g/dL   Albumin 3.4 (*) 3.5 - 5.2 g/dL   AST 27  0 - 37 U/L   ALT 32  0 - 53 U/L   Alkaline Phosphatase 62  39 - 117 U/L   Total Bilirubin 0.2 (*) 0.3 - 1.2 mg/dL   GFR calc non Af Amer >90  >90  mL/min   GFR calc Af Amer >90  >90 mL/min  PHENYTOIN LEVEL, TOTAL     Status: None   Collection Time    02/27/13  1:53 AM      Result Value Range   Phenytoin Lvl 17.2  10.0 - 20.0 ug/mL  LACTIC ACID, PLASMA     Status: None   Collection Time    02/27/13  1:55 AM      Result Value Range   Lactic Acid, Venous 1.2  0.5 - 2.2 mmol/L  CBC WITH DIFFERENTIAL     Status: Abnormal   Collection Time    02/27/13  2:03 AM      Result Value Range   WBC 6.5  4.0 - 10.5 K/uL   RBC 4.16 (*) 4.22 - 5.81 MIL/uL   Hemoglobin 13.6  13.0 - 17.0 g/dL   HCT 09.6 (*) 04.5 - 40.9 %   MCV 92.8  78.0 - 100.0 fL   MCH 32.7  26.0 - 34.0 pg   MCHC 35.2  30.0 - 36.0 g/dL   RDW 81.1  91.4 - 78.2 %   Platelets 179  150 - 400 K/uL   Neutrophils Relative % 53  43 - 77 %   Neutro Abs 3.5  1.7 - 7.7 K/uL   Lymphocytes Relative 36  12 - 46 %   Lymphs Abs 2.3  0.7 - 4.0 K/uL   Monocytes Relative 8  3 - 12 %   Monocytes Absolute 0.5  0.1 - 1.0 K/uL   Eosinophils Relative 3  0 - 5 %   Eosinophils Absolute 0.2  0.0 - 0.7 K/uL   Basophils Relative 1  0 - 1 %   Basophils Absolute 0.0  0.0 - 0.1 K/uL  URINALYSIS, ROUTINE W REFLEX MICROSCOPIC     Status: None   Collection Time    02/27/13  2:07 AM      Result Value Range   Color, Urine YELLOW  YELLOW   APPearance CLEAR  CLEAR   Specific Gravity, Urine 1.008  1.005 - 1.030   pH 5.5  5.0 - 8.0   Glucose, UA NEGATIVE  NEGATIVE mg/dL   Hgb urine dipstick NEGATIVE  NEGATIVE   Bilirubin Urine NEGATIVE  NEGATIVE   Ketones, ur NEGATIVE  NEGATIVE mg/dL   Protein, ur NEGATIVE  NEGATIVE mg/dL   Urobilinogen, UA 0.2  0.0 - 1.0 mg/dL  Nitrite NEGATIVE  NEGATIVE   Leukocytes, UA NEGATIVE  NEGATIVE  URINE RAPID DRUG SCREEN (HOSP PERFORMED)     Status: Abnormal   Collection Time    02/27/13  2:07 AM      Result Value Range   Opiates NONE DETECTED  NONE DETECTED   Cocaine POSITIVE (*) NONE DETECTED   Benzodiazepines POSITIVE (*) NONE DETECTED   Amphetamines NONE  DETECTED  NONE DETECTED   Tetrahydrocannabinol NONE DETECTED  NONE DETECTED   Barbiturates NONE DETECTED  NONE DETECTED      MDM   The patient is here with status epilepticus. His seizure activity appears to have resolved. However, his mental status has not normalized. His Dilantin level is therapeutic. However, he is noted to have  cocaine metabolites in his tox screen. He is also noted to have a right lower lobe infiltrate which, in the setting of his history, is concerning for aspiration pneumonia. The patient remains mildly cachectic. We will cover appear clear with clindamycin and obtain blood cultures. This patient will need to be admitted.  2130: patient re-evaluated. Arouses to sternal rub. Due to prolonged AMS following last witnessed seizure which was 4h ago, we will consult Neurology and obtain CT brain.     Brandt Loosen, MD 02/27/13 [phone]  CRITICAL CARE Performed by: Brandt Loosen   Total critical care time: 50m  Critical care time was exclusive of separately billable procedures and treating other patients.  Critical care was necessary to treat or prevent imminent or life-threatening deterioration.  Critical care was time spent personally by me on the following activities: development of treatment plan with patient and/or surrogate as well as nursing, discussions with consultants, evaluation of patient's response to treatment, examination of patient, obtaining history from patient or surrogate, ordering and performing treatments and interventions, ordering and review of laboratory studies, ordering and review of radiographic studies, pulse oximetry and re-evaluation of patient's condition.   Brandt Loosen, MD 02/27/13 0313  0600:  Patient remains difficult to arouse. Although, his MS has improved and, with vigorous stimulus he is able to state place. Dr. Roseanne Reno has consulted and recommends addition of Vimpat. He will write for load in the ED. He recommends observation on the  hospitalist service for prolonged post-ictal state.   Brandt Loosen, MD 02/27/13 [phone]

## 2013-02-26 NOTE — ED Notes (Signed)
Per EMS: Pt from home, wife reports he had approx. 6 tonic clonic seizures today. Pt has been combative since EMS arrived on scene. Vitals Stable in route. Currently responsive to pain.

## 2013-02-27 ENCOUNTER — Emergency Department (HOSPITAL_COMMUNITY): Payer: Medicaid Other

## 2013-02-27 DIAGNOSIS — G40901 Epilepsy, unspecified, not intractable, with status epilepticus: Secondary | ICD-10-CM | POA: Diagnosis present

## 2013-02-27 DIAGNOSIS — G40401 Other generalized epilepsy and epileptic syndromes, not intractable, with status epilepticus: Principal | ICD-10-CM

## 2013-02-27 DIAGNOSIS — F1910 Other psychoactive substance abuse, uncomplicated: Secondary | ICD-10-CM | POA: Diagnosis present

## 2013-02-27 DIAGNOSIS — F1410 Cocaine abuse, uncomplicated: Secondary | ICD-10-CM

## 2013-02-27 DIAGNOSIS — G9340 Encephalopathy, unspecified: Secondary | ICD-10-CM

## 2013-02-27 LAB — COMPREHENSIVE METABOLIC PANEL
AST: 27 U/L (ref 0–37)
Albumin: 3.4 g/dL — ABNORMAL LOW (ref 3.5–5.2)
BUN: 16 mg/dL (ref 6–23)
Chloride: 106 mEq/L (ref 96–112)
Creatinine, Ser: 0.87 mg/dL (ref 0.50–1.35)
Potassium: 3.8 mEq/L (ref 3.5–5.1)
Total Bilirubin: 0.2 mg/dL — ABNORMAL LOW (ref 0.3–1.2)
Total Protein: 6.1 g/dL (ref 6.0–8.3)

## 2013-02-27 LAB — CBC WITH DIFFERENTIAL/PLATELET
Basophils Relative: 1 % (ref 0–1)
HCT: 38.6 % — ABNORMAL LOW (ref 39.0–52.0)
Hemoglobin: 13.6 g/dL (ref 13.0–17.0)
Lymphocytes Relative: 36 % (ref 12–46)
Lymphs Abs: 2.3 10*3/uL (ref 0.7–4.0)
MCHC: 35.2 g/dL (ref 30.0–36.0)
Monocytes Absolute: 0.5 10*3/uL (ref 0.1–1.0)
Monocytes Relative: 8 % (ref 3–12)
Neutro Abs: 3.5 10*3/uL (ref 1.7–7.7)
Neutrophils Relative %: 53 % (ref 43–77)
RBC: 4.16 MIL/uL — ABNORMAL LOW (ref 4.22–5.81)

## 2013-02-27 LAB — RAPID URINE DRUG SCREEN, HOSP PERFORMED
Amphetamines: NOT DETECTED
Barbiturates: NOT DETECTED
Tetrahydrocannabinol: NOT DETECTED

## 2013-02-27 LAB — URINALYSIS, ROUTINE W REFLEX MICROSCOPIC
Bilirubin Urine: NEGATIVE
Glucose, UA: NEGATIVE mg/dL
Hgb urine dipstick: NEGATIVE
Protein, ur: NEGATIVE mg/dL
Urobilinogen, UA: 0.2 mg/dL (ref 0.0–1.0)

## 2013-02-27 LAB — CREATININE, SERUM: Creatinine, Ser: 0.7 mg/dL (ref 0.50–1.35)

## 2013-02-27 LAB — LACTIC ACID, PLASMA: Lactic Acid, Venous: 1.2 mmol/L (ref 0.5–2.2)

## 2013-02-27 LAB — PHENYTOIN LEVEL, TOTAL: Phenytoin Lvl: 17.2 ug/mL (ref 10.0–20.0)

## 2013-02-27 MED ORDER — ONDANSETRON HCL 4 MG PO TABS: 4.0000 mg | ORAL_TABLET | Freq: Four times a day (QID) | ORAL | Status: DC | PRN

## 2013-02-27 MED ORDER — SENNOSIDES-DOCUSATE SODIUM 8.6-50 MG PO TABS
1.0000 | ORAL_TABLET | Freq: Every evening | ORAL | Status: DC | PRN
  Filled 2013-02-27: qty 1

## 2013-02-27 MED ORDER — LORAZEPAM 2 MG/ML IJ SOLN: 2.0000 mg | INTRAMUSCULAR | Status: DC | PRN

## 2013-02-27 MED ORDER — CLINDAMYCIN PHOSPHATE 600 MG/50ML IV SOLN
600.0000 mg | Freq: Once | INTRAVENOUS | Status: AC
  Administered 2013-02-27: 600 mg via INTRAVENOUS
  Filled 2013-02-27: qty 50

## 2013-02-27 MED ORDER — ACETAMINOPHEN 650 MG RE SUPP: 650.0000 mg | Freq: Four times a day (QID) | RECTAL | Status: DC | PRN

## 2013-02-27 MED ORDER — ACETAMINOPHEN 325 MG PO TABS: 650.0000 mg | ORAL_TABLET | Freq: Four times a day (QID) | ORAL | Status: DC | PRN

## 2013-02-27 MED ORDER — ONDANSETRON HCL 4 MG/2ML IJ SOLN: 4.0000 mg | Freq: Four times a day (QID) | INTRAMUSCULAR | Status: DC | PRN

## 2013-02-27 MED ORDER — SODIUM CHLORIDE 0.9 % IV SOLN
INTRAVENOUS | Status: DC
  Administered 2013-02-27: 11:00:00 via INTRAVENOUS

## 2013-02-27 MED ORDER — SODIUM CHLORIDE 0.9 % IV SOLN
200.0000 mg | Freq: Once | INTRAVENOUS | Status: AC
  Administered 2013-02-27: 200 mg via INTRAVENOUS
  Filled 2013-02-27: qty 20

## 2013-02-27 MED ORDER — PHENYTOIN SODIUM EXTENDED 100 MG PO CAPS
400.0000 mg | ORAL_CAPSULE | Freq: Every day | ORAL | Status: DC
  Administered 2013-02-27: 400 mg via ORAL
  Filled 2013-02-27: qty 4

## 2013-02-27 MED ORDER — HEPARIN SODIUM (PORCINE) 5000 UNIT/ML IJ SOLN
5000.0000 [IU] | Freq: Three times a day (TID) | INTRAMUSCULAR | Status: DC
  Administered 2013-02-27 (×2): 5000 [IU] via SUBCUTANEOUS
  Filled 2013-02-27 (×3): qty 1

## 2013-02-27 MED ORDER — LACOSAMIDE 200 MG PO TABS
100.0000 mg | ORAL_TABLET | Freq: Two times a day (BID) | ORAL | Status: DC
  Administered 2013-02-27: 100 mg via ORAL
  Filled 2013-02-27: qty 1

## 2013-02-27 NOTE — ED Notes (Addendum)
Attempted to call report to unit. Nurse to call back.

## 2013-02-27 NOTE — Progress Notes (Signed)
Seen this AM by Dr. Roseanne Reno. Now completely back to baseline. He had recurrent seizures in the setting of cocaine use. I do not think an additional AED is necessary, but he should stop cocaine. He expresses understanding. Ok to D/C home from neuro perspctive.    Ritta Slot, MD [facility] [phone]  If 7pm- 7am, please page neurology on call at [phone].

## 2013-02-27 NOTE — Discharge Summary (Signed)
Physician Discharge Summary  Arthur Baird WUJ:[id number] DOB: [birth year] DOA: 02/26/2013  PCP: Pcp Not In System  Admit date: 02/26/2013 Discharge date: 02/27/2013  Time spent: 30 minutes  Recommendations for Outpatient Follow-up:  1. Follow up with PCP in 1-2 weeks 2. Pt reminded not to use cocaine  Discharge Diagnoses:  Active Problems:   Status epilepticus, generalized convulsive   Cocaine abuse, continuous   Polysubstance abuse   Discharge Condition: Stable  Diet recommendation: Regular  Filed Weights   02/27/13 0937  Weight: 68.04 kg (150 lb)    History of present illness:  Arthur Baird is a 32 y.o. male  With a history of known seizures who presents to the ED with a recurrent seizure lasting over 3-5 hours. The patient had been on Keppra and claims to be compliant with his med. In the Ed, UDS was pos for cocaine. He was given 2mg  Ativan. Neurology was consulted. The hosptialist was consulted for admission  Hospital Course:  The patient was admitted to the floor. Neurology was consulted. Initially, the patient was continued on his home dose of dilantin with the addition of vimpat. Ultimately, the patient returned to baseline status. The case was discussed with the Neurologist following. It was determined that the pt's seizures were likely secondary to cocaine abuse and that no additional meds were needed. The patient was subsequently discharged home on his home Dilantin dose and reminded not to use cocaine.  Consultations:  Neurology  Discharge Exam: Filed Vitals:   02/27/13 0727 02/27/13 0937 02/27/13 1414 02/27/13 1843  BP: 110/73 120/84 119/80 121/78  Pulse: 72 73 93 80  Temp:  97.3 F (36.3 C) 97.5 F (36.4 C) 97.3 F (36.3 C)  TempSrc:  Oral Oral Oral  Resp: 20 16 18 18   Height:  5\' 10"  (1.778 m)    Weight:  68.04 kg (150 lb)    SpO2: 100% 100% 100% 100%    General: Awake, in nad Cardiovascular: regular, s1,s2 Respiratory: Normal resp effort, no  wheezing  Discharge Instructions       Future Appointments Provider Department Dept Phone   08/22/2013 8:30 AM Nilda Riggs, NP [facility] [phone]       Medication List         phenytoin 100 MG ER capsule  Commonly known as:  DILANTIN  Take 400 mg by mouth daily with breakfast. 9am or 10am       Allergies  Allergen Reactions  . Keppra [Levetiracetam] Other (See Comments)    Causes severe migraines  . Penicillins Other (See Comments)    Childhood reaction  . Tramadol Other (See Comments)    Can't pee when taking medication   Follow-up Information   Follow up with Follow up with PCP in 1-2 weeks.       The results of significant diagnostics from this hospitalization (including imaging, microbiology, ancillary and laboratory) are listed below for reference.    Significant Diagnostic Studies: Ct Head Wo Contrast  02/27/2013   *RADIOLOGY REPORT*  Clinical Data: Seizures.  CT HEAD WITHOUT CONTRAST  Technique:  Contiguous axial images were obtained from the base of the skull through the vertex without contrast.  Comparison: 02/11/2013  Findings: Prominent CSF space in the left middle cranial fossa probably represents a small arachnoid cyst.  This is stable since previous study.  No mass effect or midline shift.  No ventricular dilatation.  Gray-white matter junctions are distinct.  Basal cisterns are not effaced.  No evidence  of acute intracranial hemorrhage.  No depressed skull fractures. Old nasal bone fractures.  Visualized paranasal sinuses and mastoid air cells are not opacified.  No significant change since previous study.  IMPRESSION: No acute intracranial abnormalities.  Stable appearance since previous study.   Original Report Authenticated By: Burman Nieves, M.D.   Ct Head Wo Contrast  02/11/2013   *RADIOLOGY REPORT*  Clinical Data: Loss of consciousness.  CT HEAD WITHOUT CONTRAST  Technique:  Contiguous axial images were obtained from the  base of the skull through the vertex without contrast.  Comparison: 08/13/2012  Findings: No evidence for acute hemorrhage, midline shift, hydrocephalus or large infarct.  Again noted is a CSF attenuating structure/area in the left middle cranial fossa.  This probably represents a small arachnoid cyst and unchanged since 07/08/2011. Mild mucosal disease in left posterior ethmoid air cells.  Question mildly displaced left nasal bone fractures of unknown age.  Impression:  No acute intracranial abnormality.  Probable arachnoid cyst that is stable.  Question minimally displaced left nasal bone fractures of unknown age.   Original Report Authenticated By: Richarda Overlie, M.D.   Dg Chest Port 1 View  02/27/2013   *RADIOLOGY REPORT*  Clinical Data: Seizure, history hypertension, PTSD, seizures, history smoking  PORTABLE CHEST - 1 VIEW  Comparison: Portable exam 0008 hours compared to 06/12/2011  Findings: Normal heart size, mediastinal contours, and pulmonary vascularity. Atelectasis versus infiltrate at right base. Remaining lungs clear. No pleural effusion or pneumothorax. Underlying hyperaeration.  IMPRESSION: Atelectasis versus infiltrate right base.   Original Report Authenticated By: Ulyses Southward, M.D.    Microbiology: No results found for this or any previous visit (from the past 240 hour(s)).   Labs: Basic Metabolic Panel:  Recent Labs Lab 02/27/13 0153 02/27/13 0810  NA 140  --   K 3.8  --   CL 106  --   CO2 26  --   GLUCOSE 72  --   BUN 16  --   CREATININE 0.87 0.70  CALCIUM 8.3*  --    Liver Function Tests:  Recent Labs Lab 02/27/13 0153  AST 27  ALT 32  ALKPHOS 62  BILITOT 0.2*  PROT 6.1  ALBUMIN 3.4*   No results found for this basename: LIPASE, AMYLASE,  in the last 168 hours No results found for this basename: AMMONIA,  in the last 168 hours CBC:  Recent Labs Lab 02/27/13 0203  WBC 6.5  NEUTROABS 3.5  HGB 13.6  HCT 38.6*  MCV 92.8  PLT 179   Cardiac Enzymes: No  results found for this basename: CKTOTAL, CKMB, CKMBINDEX, TROPONINI,  in the last 168 hours BNP: BNP (last 3 results) No results found for this basename: PROBNP,  in the last 8760 hours CBG: No results found for this basename: GLUCAP,  in the last 168 hours     Signed:  Zachory Mangual K  [facility] 02/27/2013, 6:47 PM

## 2013-02-27 NOTE — H&P (Addendum)
[facility] History and Physical  [name]:[id number] DOB: 1980-10-25 DOA: 02/26/2013  Referring physician: Emergency Department PCP: Pcp Not In System  Specialists:   Chief Complaint: Seizures  HPI: Arthur Baird is a 32 y.o. male  With a history of known seizures who presents to the ED with a recurrent seizure lasting over 3-5 hours. The patient had been on Keppra and claims to be compliant with his med. In the Ed, UDS was pos for cocaine. He was given 2mg  Ativan. Neurology was consulted. The hosptialist was consulted for admission  Review of Systems:  Per above, remainder of 10pt ROS reviewed and are neg  Past Medical History  Diagnosis Date  . Seizures   . Hypertension   . Acid reflux   . Arthritis   . Anxiety   . PTSD (post-traumatic stress disorder)    Past Surgical History  Procedure Laterality Date  . Knee surgery     Social History:  reports that he has been smoking Cigarettes.  He has a 32 pack-year smoking history. He quit smokeless tobacco use about 20 months ago. His smokeless tobacco use included Chew. He reports that he drinks about 1.2 ounces of alcohol per week. He reports that he uses illicit drugs (Marijuana).  where does patient live--home, ALF, SNF? and with whom if at home?  Can patient participate in ADLs?  Allergies  Allergen Reactions  . Keppra [Levetiracetam] Other (See Comments)    Causes severe migraines  . Penicillins Other (See Comments)    Childhood reaction  . Tramadol Other (See Comments)    Can't pee when taking medication    No family history on file.  (be sure to complete)  Prior to Admission medications   Medication Sig Start Date End Date Taking? Authorizing Provider  phenytoin (DILANTIN) 100 MG ER capsule Take 400 mg by mouth daily with breakfast. 9am or 10am   Yes Historical Provider, MD   Physical Exam: Filed Vitals:   02/27/13 0445 02/27/13 0500 02/27/13 0600 02/27/13 0727  BP: 109/75 112/73 117/86  110/73  Pulse: 78 78 64 72  Temp:      TempSrc:      Resp: 17 18 14 20   SpO2: 100% 100% 100% 100%     General:  Arousable, in nad  Eyes: PERRL B  ENT: membranes moist, dentition fair  Neck: Trachea midline, neck supple  Cardiovascular: regular, s1, s2  Respiratory: normal resp effort, no wheezing  Abdomen: soft, nondistended  Skin: normal skin turgor, no abnormal skin lesions seen  Musculoskeletal: perfused, no clubbing or cyanosis  Psychiatric: mood/affect normal // no auditory/visual hallucinations  Neurologic: cn2-12 grossly intact, strength/sensation intact  Labs on Admission:  Basic Metabolic Panel:  Recent Labs Lab 02/27/13 0153  NA 140  K 3.8  CL 106  CO2 26  GLUCOSE 72  BUN 16  CREATININE 0.87  CALCIUM 8.3*   Liver Function Tests:  Recent Labs Lab 02/27/13 0153  AST 27  ALT 32  ALKPHOS 62  BILITOT 0.2*  PROT 6.1  ALBUMIN 3.4*   No results found for this basename: LIPASE, AMYLASE,  in the last 168 hours No results found for this basename: AMMONIA,  in the last 168 hours CBC:  Recent Labs Lab 02/27/13 0203  WBC 6.5  NEUTROABS 3.5  HGB 13.6  HCT 38.6*  MCV 92.8  PLT 179   Cardiac Enzymes: No results found for this basename: CKTOTAL, CKMB, CKMBINDEX, TROPONINI,  in the last 168 hours  BNP (last 3 results) No results found for this basename: PROBNP,  in the last 8760 hours CBG: No results found for this basename: GLUCAP,  in the last 168 hours  Radiological Exams on Admission: Ct Head Wo Contrast  02/27/2013   *RADIOLOGY REPORT*  Clinical Data: Seizures.  CT HEAD WITHOUT CONTRAST  Technique:  Contiguous axial images were obtained from the base of the skull through the vertex without contrast.  Comparison: 02/11/2013  Findings: Prominent CSF space in the left middle cranial fossa probably represents a small arachnoid cyst.  This is stable since previous study.  No mass effect or midline shift.  No ventricular dilatation.  Gray-white  matter junctions are distinct.  Basal cisterns are not effaced.  No evidence of acute intracranial hemorrhage.  No depressed skull fractures. Old nasal bone fractures.  Visualized paranasal sinuses and mastoid air cells are not opacified.  No significant change since previous study.  IMPRESSION: No acute intracranial abnormalities.  Stable appearance since previous study.   Original Report Authenticated By: Burman Nieves, M.D.   Dg Chest Port 1 View  02/27/2013   *RADIOLOGY REPORT*  Clinical Data: Seizure, history hypertension, PTSD, seizures, history smoking  PORTABLE CHEST - 1 VIEW  Comparison: Portable exam 0008 hours compared to 06/12/2011  Findings: Normal heart size, mediastinal contours, and pulmonary vascularity. Atelectasis versus infiltrate at right base. Remaining lungs clear. No pleural effusion or pneumothorax. Underlying hyperaeration.  IMPRESSION: Atelectasis versus infiltrate right base.   Original Report Authenticated By: Ulyses Southward, M.D.    Assessment/Plan Active Problems:   Status epilepticus, generalized convulsive   Cocaine abuse, continuous   Polysubstance abuse   1. Seizures/Status 1. Neurology consulted, appreciate recs 2. Med changes noted per Neurology 3. Will admit to floor, obs status 2. Polysubstance abuse 1. Cocaine Pos 2. Would address when not post-ictal 3. DVT prophylaxis 1. Heparin subQ  Code Status: Full (must indicate code status--if unknown or must be presumed, indicate so) Family Communication: Pt in room (indicate person spoken with, if applicable, with phone number if by telephone) Disposition Plan: Pending (indicate anticipated LOS)  Time spent:  Tangie Stay K [facility] Pager [phone]  If 7PM-7AM, please contact night-coverage [email or web address] Password [email or web address] 02/27/2013, 8:21 AM

## 2013-02-27 NOTE — Progress Notes (Signed)
Utilization Review completed.  

## 2013-02-27 NOTE — Consult Note (Signed)
Reason for Consult: Recurrent generalized seizures.  HPI:                                                                                                                                          Arthur Baird is an 32 y.o. male with a history of seizure disorder, hypertension, and posttraumatic stress disorder and was brought to the emergency room following multiple witnessed seizures with a pattern consistent with generalized status epilepticus. Patient has been on Dilantin 100 mg per day. Dilantin level was 17.2. Urine drug screen was positive for cocaine. Patient was given 2 mg of Ativan IV. No recurrent seizure activity reported. Patient was postictal and quite stuporous and required admission for observation. He was previously treated with Keppra which gave him severe headaches. He was switched from Keppra to Dilantin. His last seizure was on 02/12/2013.  Past Medical History  Diagnosis Date  . Seizures   . Hypertension   . Acid reflux   . Arthritis   . Anxiety   . PTSD (post-traumatic stress disorder)     Past Surgical History  Procedure Laterality Date  . Knee surgery      No family history on file.  Social History:  reports that he has been smoking Cigarettes.  He has a 32 pack-year smoking history. He quit smokeless tobacco use about 20 months ago. His smokeless tobacco use included Chew. He reports that he drinks about 1.2 ounces of alcohol per week. He reports that he uses illicit drugs (Marijuana).  Allergies  Allergen Reactions  . Keppra [Levetiracetam] Other (See Comments)    Causes severe migraines  . Penicillins Other (See Comments)    Childhood reaction  . Tramadol Other (See Comments)    Can't pee when taking medication    MEDICATIONS:                                                                                                                     I have reviewed the patient's current medications.   ROS:  History obtained from spouse  General ROS: negative for - chills, fatigue, fever, night sweats, weight gain or weight loss Psychological ROS: negative for - behavioral disorder, hallucinations, memory difficulties, mood swings or suicidal ideation Ophthalmic ROS: negative for - blurry vision, double vision, eye pain or loss of vision ENT ROS: negative for - epistaxis, nasal discharge, oral lesions, sore throat, tinnitus or vertigo Allergy and Immunology ROS: negative for - hives or itchy/watery eyes Hematological and Lymphatic ROS: negative for - bleeding problems, bruising or swollen lymph nodes Endocrine ROS: negative for - galactorrhea, hair pattern changes, polydipsia/polyuria or temperature intolerance Respiratory ROS: negative for - cough, hemoptysis, shortness of breath or wheezing Cardiovascular ROS: negative for - chest pain, dyspnea on exertion, edema or irregular heartbeat Gastrointestinal ROS: negative for - abdominal pain, diarrhea, hematemesis, nausea/vomiting or stool incontinence Genito-Urinary ROS: negative for - dysuria, hematuria, incontinence or urinary frequency/urgency Musculoskeletal ROS: negative for - joint swelling or muscular weakness Neurological ROS: as noted in HPI Dermatological ROS: negative for rash and skin lesion changes   Blood pressure 112/73, pulse 78, temperature 97.4 F (36.3 C), temperature source Oral, resp. rate 18, SpO2 100.00%.   Neurologic Examination:                                                                                                      Mental Status: Somnolent, moderately difficult to arouse. Oriented to place and person but not to time.  Speech slightly slurred, commensurate with level of alertness. . Able to follow commands without difficulty. Cranial Nerves: II-Visual fields were normal. III/IV/VI-Pupils were equal and reacted. Extraocular  movements were full and conjugate.    V/VII-no facial numbness and no facial weakness. VIII-normal. X-slurred speech, commensurate with level of alertness. Motor: 5/5 bilaterally with normal tone and bulk Sensory: Normal throughout. Deep Tendon Reflexes: 2+ and symmetric. Plantars: Flexor bilaterally Cerebellar: Normal finger-to-nose testing.  No results found for this basename: cbc, bmp, coags, chol, tri, ldl, hga1c    Results for orders placed during the hospital encounter of 02/26/13 (from the past 48 hour(s))  COMPREHENSIVE METABOLIC PANEL     Status: Abnormal   Collection Time    02/27/13  1:53 AM      Result Value Range   Sodium 140  135 - 145 mEq/L   Potassium 3.8  3.5 - 5.1 mEq/L   Chloride 106  96 - 112 mEq/L   CO2 26  19 - 32 mEq/L   Glucose, Bld 72  70 - 99 mg/dL   BUN 16  6 - 23 mg/dL   Creatinine, Ser 1.61  0.50 - 1.35 mg/dL   Calcium 8.3 (*) 8.4 - 10.5 mg/dL   Total Protein 6.1  6.0 - 8.3 g/dL   Albumin 3.4 (*) 3.5 - 5.2 g/dL   AST 27  0 - 37 U/L   ALT 32  0 - 53 U/L   Alkaline Phosphatase 62  39 - 117 U/L   Total Bilirubin 0.2 (*) 0.3 - 1.2 mg/dL   GFR calc non Af Amer >90  >90 mL/min  GFR calc Af Amer >90  >90 mL/min   Comment: (NOTE)     The eGFR has been calculated using the CKD EPI equation.     This calculation has not been validated in all clinical situations.     eGFR's persistently <90 mL/min signify possible Chronic Kidney     Disease.  PHENYTOIN LEVEL, TOTAL     Status: None   Collection Time    02/27/13  1:53 AM      Result Value Range   Phenytoin Lvl 17.2  10.0 - 20.0 ug/mL  LACTIC ACID, PLASMA     Status: None   Collection Time    02/27/13  1:55 AM      Result Value Range   Lactic Acid, Venous 1.2  0.5 - 2.2 mmol/L  CBC WITH DIFFERENTIAL     Status: Abnormal   Collection Time    02/27/13  2:03 AM      Result Value Range   WBC 6.5  4.0 - 10.5 K/uL   RBC 4.16 (*) 4.22 - 5.81 MIL/uL   Hemoglobin 13.6  13.0 - 17.0 g/dL   HCT 16.1 (*)  09.6 - 52.0 %   MCV 92.8  78.0 - 100.0 fL   MCH 32.7  26.0 - 34.0 pg   MCHC 35.2  30.0 - 36.0 g/dL   RDW 04.5  40.9 - 81.1 %   Platelets 179  150 - 400 K/uL   Neutrophils Relative % 53  43 - 77 %   Neutro Abs 3.5  1.7 - 7.7 K/uL   Lymphocytes Relative 36  12 - 46 %   Lymphs Abs 2.3  0.7 - 4.0 K/uL   Monocytes Relative 8  3 - 12 %   Monocytes Absolute 0.5  0.1 - 1.0 K/uL   Eosinophils Relative 3  0 - 5 %   Eosinophils Absolute 0.2  0.0 - 0.7 K/uL   Basophils Relative 1  0 - 1 %   Basophils Absolute 0.0  0.0 - 0.1 K/uL  URINALYSIS, ROUTINE W REFLEX MICROSCOPIC     Status: None   Collection Time    02/27/13  2:07 AM      Result Value Range   Color, Urine YELLOW  YELLOW   APPearance CLEAR  CLEAR   Specific Gravity, Urine 1.008  1.005 - 1.030   pH 5.5  5.0 - 8.0   Glucose, UA NEGATIVE  NEGATIVE mg/dL   Hgb urine dipstick NEGATIVE  NEGATIVE   Bilirubin Urine NEGATIVE  NEGATIVE   Ketones, ur NEGATIVE  NEGATIVE mg/dL   Protein, ur NEGATIVE  NEGATIVE mg/dL   Urobilinogen, UA 0.2  0.0 - 1.0 mg/dL   Nitrite NEGATIVE  NEGATIVE   Leukocytes, UA NEGATIVE  NEGATIVE   Comment: MICROSCOPIC NOT DONE ON URINES WITH NEGATIVE PROTEIN, BLOOD, LEUKOCYTES, NITRITE, OR GLUCOSE <1000 mg/dL.  URINE RAPID DRUG SCREEN (HOSP PERFORMED)     Status: Abnormal   Collection Time    02/27/13  2:07 AM      Result Value Range   Opiates NONE DETECTED  NONE DETECTED   Cocaine POSITIVE (*) NONE DETECTED   Benzodiazepines POSITIVE (*) NONE DETECTED   Amphetamines NONE DETECTED  NONE DETECTED   Tetrahydrocannabinol NONE DETECTED  NONE DETECTED   Barbiturates NONE DETECTED  NONE DETECTED   Comment:            DRUG SCREEN FOR MEDICAL PURPOSES     ONLY.  IF CONFIRMATION IS NEEDED  FOR ANY PURPOSE, NOTIFY LAB     WITHIN 5 DAYS.                LOWEST DETECTABLE LIMITS     FOR URINE DRUG SCREEN     Drug Class       Cutoff (ng/mL)     Amphetamine      1000     Barbiturate      200     Benzodiazepine   200      Tricyclics       300     Opiates          300     Cocaine          300     THC              50  ETHANOL     Status: Abnormal   Collection Time    02/27/13  3:34 AM      Result Value Range   Alcohol, Ethyl (B) 77 (*) 0 - 11 mg/dL   Comment:            LOWEST DETECTABLE LIMIT FOR     SERUM ALCOHOL IS 11 mg/dL     FOR MEDICAL PURPOSES ONLY    Ct Head Wo Contrast  02/27/2013   *RADIOLOGY REPORT*  Clinical Data: Seizures.  CT HEAD WITHOUT CONTRAST  Technique:  Contiguous axial images were obtained from the base of the skull through the vertex without contrast.  Comparison: 02/11/2013  Findings: Prominent CSF space in the left middle cranial fossa probably represents a small arachnoid cyst.  This is stable since previous study.  No mass effect or midline shift.  No ventricular dilatation.  Gray-white matter junctions are distinct.  Basal cisterns are not effaced.  No evidence of acute intracranial hemorrhage.  No depressed skull fractures. Old nasal bone fractures.  Visualized paranasal sinuses and mastoid air cells are not opacified.  No significant change since previous study.  IMPRESSION: No acute intracranial abnormalities.  Stable appearance since previous study.   Original Report Authenticated By: Burman Nieves, M.D.   Dg Chest Port 1 View  02/27/2013   *RADIOLOGY REPORT*  Clinical Data: Seizure, history hypertension, PTSD, seizures, history smoking  PORTABLE CHEST - 1 VIEW  Comparison: Portable exam 0008 hours compared to 06/12/2011  Findings: Normal heart size, mediastinal contours, and pulmonary vascularity. Atelectasis versus infiltrate at right base. Remaining lungs clear. No pleural effusion or pneumothorax. Underlying hyperaeration.  IMPRESSION: Atelectasis versus infiltrate right base.   Original Report Authenticated By: Ulyses Southward, M.D.   Assessment/Plan: 32 year old man with known seizure disorder presenting with pattern of seizure activity consistent with generalized status  epilepticus. Patient is on therapeutic dose of Dilantin. Urine was positive for cocaine, which may be a contributing factor to patient's recurrent seizure activity.  Recommend loading dose of Vimpat 200 mg followed by 100 mg twice a day. He is to continue Dilantin 400 mg daily. Agree with admission since patient is still postictal and somnolent as well as confused. We will continue to follow this patient as well during this hospitalization.   C.R. Roseanne Reno, MD [facility] [phone]  02/27/2013, 5:33 AM

## 2013-02-28 ENCOUNTER — Telehealth: Payer: Self-pay | Admitting: Neurology

## 2013-02-28 LAB — POCT I-STAT, CHEM 8
Calcium, Ion: 1.13 mmol/L (ref 1.12–1.23)
Creatinine, Ser: 1.1 mg/dL (ref 0.50–1.35)
Hemoglobin: 13.3 g/dL (ref 13.0–17.0)
Sodium: 142 mEq/L (ref 135–145)
TCO2: 23 mmol/L (ref 0–100)

## 2013-02-28 LAB — POCT I-STAT 3, VENOUS BLOOD GAS (G3P V)
O2 Saturation: 99 %
TCO2: 26 mmol/L (ref 0–100)
pCO2, Ven: 43.3 mmHg — ABNORMAL LOW (ref 45.0–50.0)

## 2013-02-28 NOTE — Telephone Encounter (Signed)
I read the ER record, it says to stop cocaine. His blood level of Dilantin was good.

## 2013-02-28 NOTE — Telephone Encounter (Signed)
I spoke to wife who stated that the patient was having back to back seizures could not be easily aroused from one, he went to ER and was admitted overnight.  There was a witnessed seizure in the ER.  She said they would only discharge him was if someone was with him all the time.  Wife is even assisting in bathroom and says he needs to have things repeated constantly.  He had Ativan and some other IV med in hospital, but they did not change Dilantin.  The ER doctor told her he needs to be seen by his neurologist within 2 weeks.

## 2013-03-01 NOTE — Progress Notes (Signed)
Quick Note:  I spoke to wife and patient has been on 400mg  Dilantin since 02-12-13. Labs done at hospital on 02-26-13 showed therapeutic level. ______

## 2013-03-01 NOTE — Telephone Encounter (Signed)
I spoke to wife and relayed Carolyn's message that he needs to stop cocaine and that alcohol also lowers his seizure threshold.  His discharge summary said to follow up with his PCP in 1-2 weeks.

## 2013-03-05 LAB — CULTURE, BLOOD (ROUTINE X 2)
Culture: NO GROWTH
Culture: NO GROWTH

## 2013-04-28 ENCOUNTER — Other Ambulatory Visit: Payer: Self-pay

## 2013-06-05 ENCOUNTER — Telehealth: Payer: Self-pay | Admitting: Diagnostic Neuroimaging

## 2013-06-05 NOTE — Telephone Encounter (Signed)
Patient's wife called. Patient had a seizure that they were able to "talk him out of". He is back to baseline. He doesn't want to go to ER. He is willing to be seen in clinic. Will request expedited visit with Dr. Terrace Arabia or Darrol Angel.    Suanne Marker, MD 06/05/2013, 5:44 PM Certified in Neurology, Neurophysiology and Neuroimaging  [facility] [address] [place], [place] [address] [phone]

## 2013-06-06 NOTE — Telephone Encounter (Signed)
Called and spoke to mother. Patient wants to switch to another doctor. Patient will schedule apt. When they speak to Vernona Rieger. Tried to schedule apt. With Eber Jones but the only day they could come was Thursday 06-09-2013.

## 2013-06-06 NOTE — Telephone Encounter (Signed)
Arthur Baird, please schedule this patient this week

## 2013-06-06 NOTE — Telephone Encounter (Signed)
Ok to switch. Arthur Baird

## 2013-06-06 NOTE — Telephone Encounter (Signed)
Spoke with wife and she would like to switch to Dr Marjory Lies,

## 2013-06-08 NOTE — Telephone Encounter (Signed)
Called and spoke  to patient and his wife Basilia Jumbo to both of them . Patient put his wife Coralee North on the phone.  Patient has agreered to See Dr.Yan this Friday the December 19 th because of CX. Tried to get patient and his wife to come in and be seen and they finally agreered.  Apt was offered to them Monday 06-06-2013 and Tuesday 06-07-2013 with Dr.Yan - Patient refused.. Patient and his wife only could come Thursday 06-09-2013.  Patient and his wife wants  Vernona Rieger to call them   Thursday 06-09-2013. @ [phone]. Explained Vernona Rieger was in a meeting and Eber Jones did not have any appt's open Until March 2015. Patient and his wife were fine and understood everything.

## 2013-06-10 ENCOUNTER — Ambulatory Visit: Payer: Self-pay | Admitting: Neurology

## 2013-07-25 ENCOUNTER — Other Ambulatory Visit: Payer: Self-pay | Admitting: Neurology

## 2013-08-22 ENCOUNTER — Ambulatory Visit: Payer: Medicaid Other | Admitting: Nurse Practitioner

## 2013-10-21 ENCOUNTER — Telehealth: Payer: Self-pay | Admitting: Neurology

## 2013-10-28 NOTE — Telephone Encounter (Signed)
Please switch patient's to Dr. Hosie PoissonSumner per patient's own request,

## 2013-10-31 ENCOUNTER — Telehealth: Payer: Self-pay

## 2013-10-31 DIAGNOSIS — G40309 Generalized idiopathic epilepsy and epileptic syndromes, not intractable, without status epilepticus: Secondary | ICD-10-CM

## 2013-10-31 NOTE — Telephone Encounter (Signed)
Called patient and he stated that he was not coming back to this office and the patient also stated every one here was going to hell.  And that he was recording everything I was saying.

## 2013-10-31 NOTE — Telephone Encounter (Signed)
Dr. Hosie PoissonSumner Please Advise if you would like to see this patient.

## 2013-10-31 NOTE — Telephone Encounter (Signed)
[facility]

## 2013-11-01 ENCOUNTER — Encounter: Payer: Self-pay | Admitting: Neurology

## 2013-11-03 ENCOUNTER — Telehealth: Payer: Self-pay | Admitting: Neurology

## 2013-11-03 NOTE — Telephone Encounter (Signed)
Patient's wife calling wanting to know why patient has been dismissed form our office, and also wants us to know that patient is still having headaches and nosebleeds and is on Dilantin 400 mg. Please call patient and advise.

## 2013-11-03 NOTE — Telephone Encounter (Signed)
Called pt and spoke with pt's wife Coralee Northina and she stated that the pt was still having headaches and nosebleeds and wanted to know why pt was dismissed. I explained to the wife that the pt has been dismissed from the practice and that the pt needed to find him another Neurologist and if he is having nosebleeds, he needed to go to the ER. Pt's wife hung up in my face.

## 2013-11-17 IMAGING — CR DG HIP (WITH OR WITHOUT PELVIS) 2-3V*L*
2 series · 2 of 2 positions shown · non-contrast
Comparison: None.

CLINICAL DATA: 30-year-old male with pain.

LEFT HIP - COMPLETE 2+ VIEW

[t hip ap left]
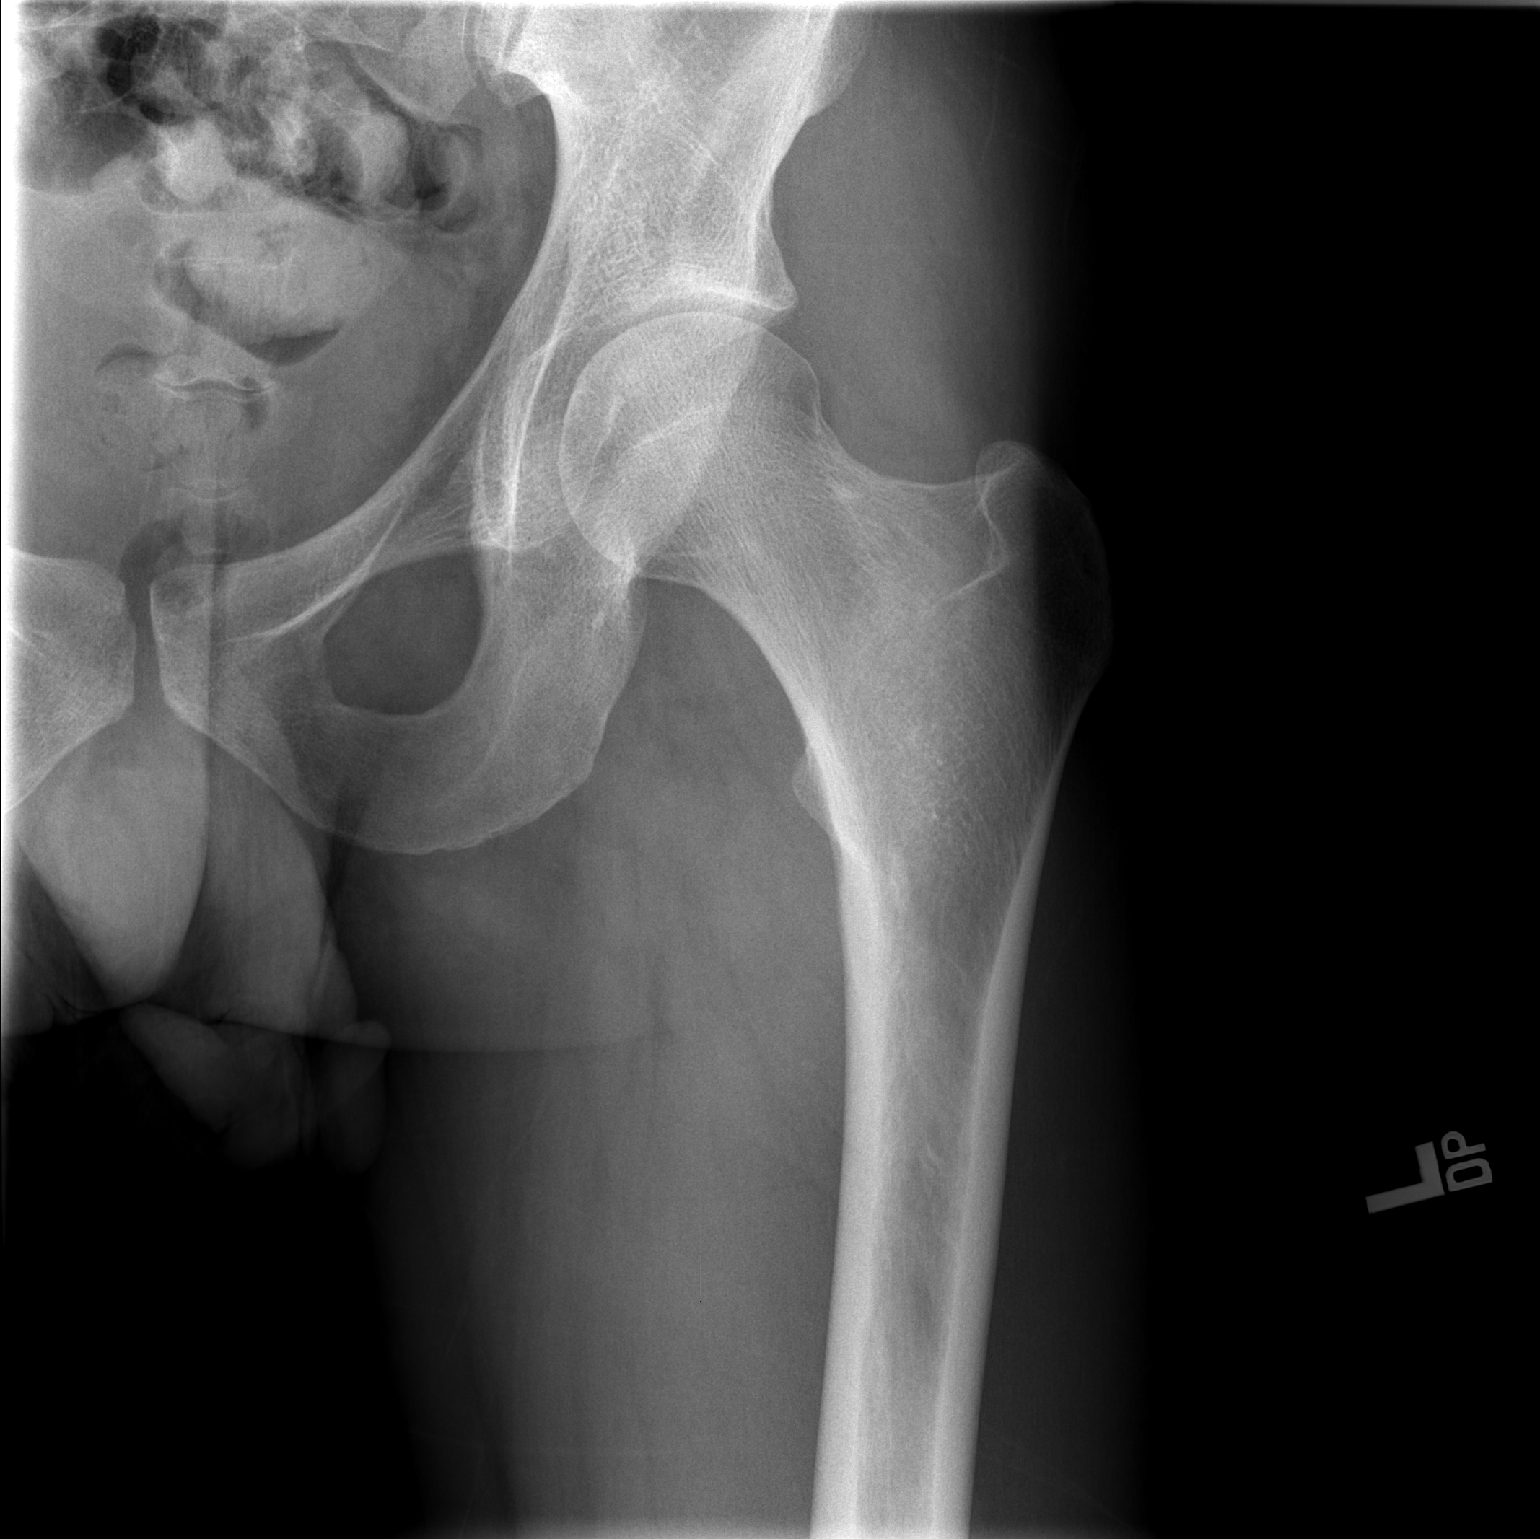

[t hip frog leg left]
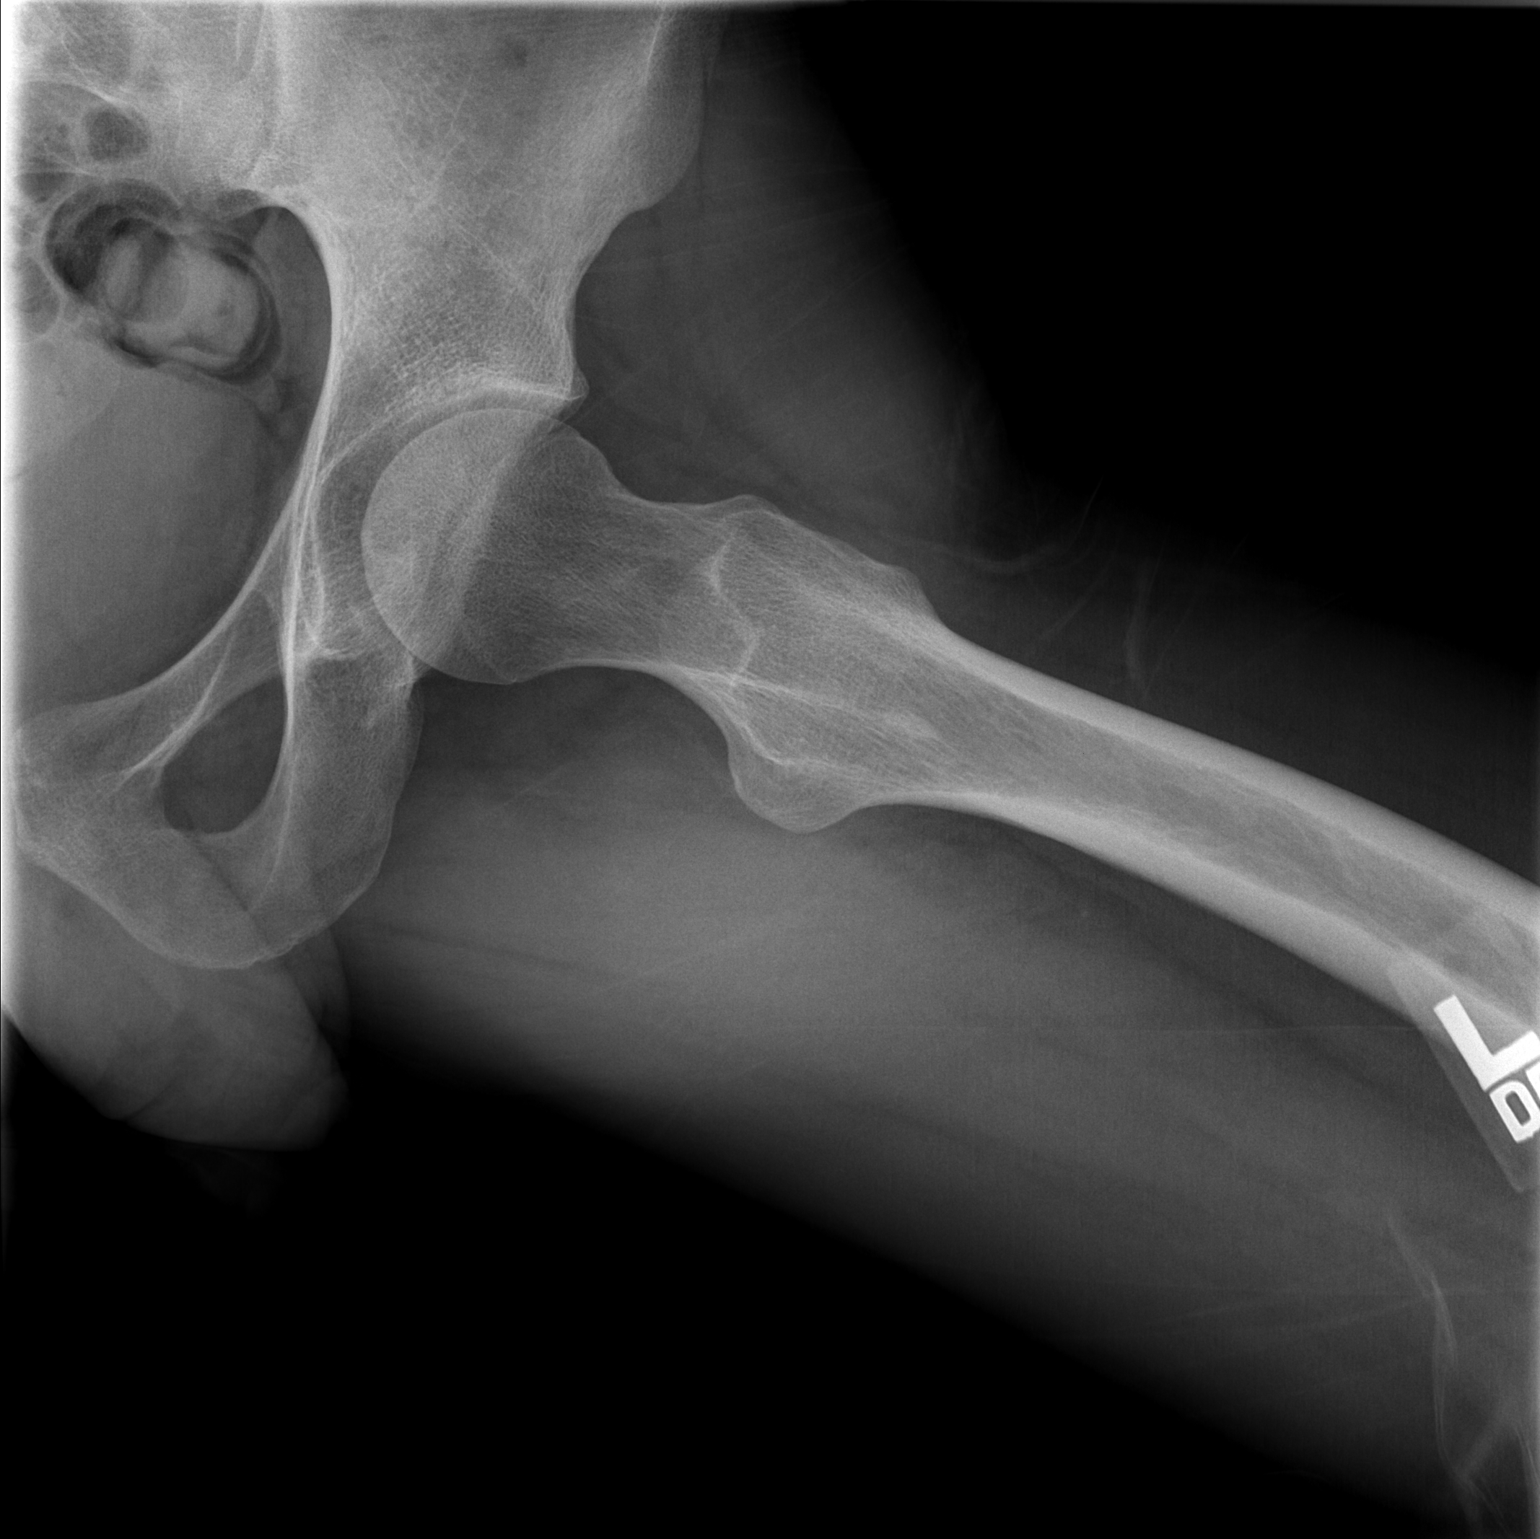

[2 of 2 positions shown; findings below may reference images not displayed]

FINDINGS: Bone mineralization is within normal limits.  Left
femoral head normally located.  Preserved left hip joint space.  No
degenerative osseous changes.  Proximal left femur intact.
Visualized left hemi pelvis intact.
IMPRESSION: Negative radiographic appearance of the left hip.

## 2013-12-13 IMAGING — CT CT HEAD W/O CM
2 series · 17 of 30 positions shown, 20 images · non-contrast
Comparison: None.

CLINICAL DATA: Seizure

CT HEAD WITHOUT CONTRAST
TECHNIQUE: Contiguous axial images were obtained from the base of
the skull through the vertex without contrast.

[Series 2: head w/o · axial · non-contrast · 0.43mm/px · z∈[-142,-27]mm · 9 of 29 slices shown, 12 images]
[im 3/29  brain]
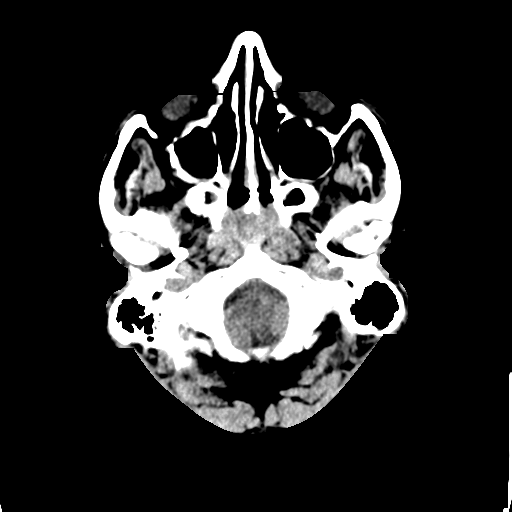
[im 3/29  bone]
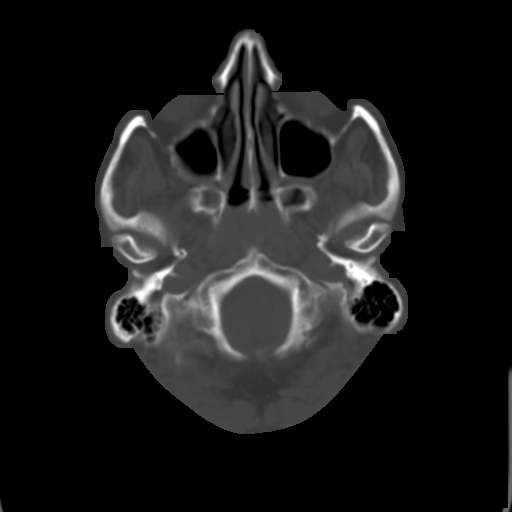
[im 6/29  brain]
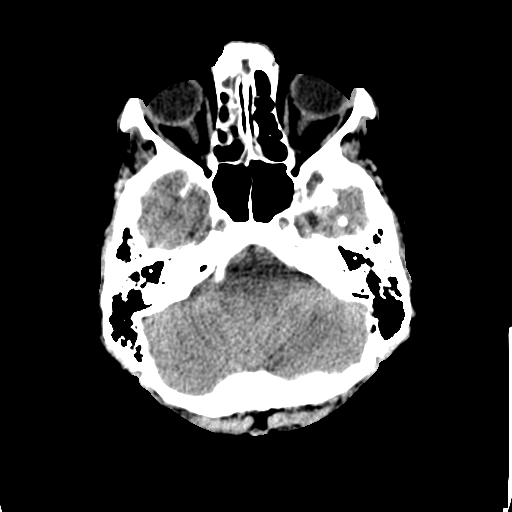
[im 9/29  brain]
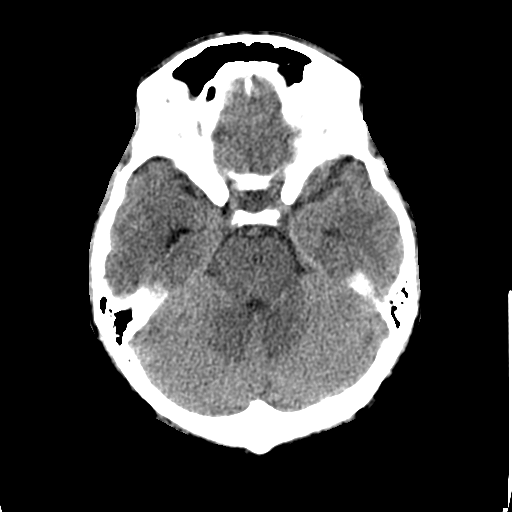
[im 12/29  brain]
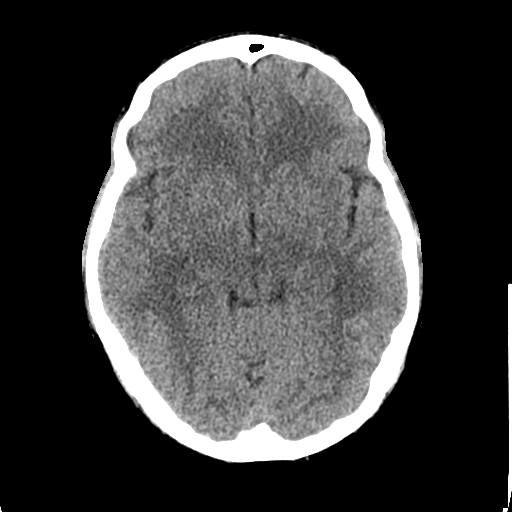
[im 15/29  brain]
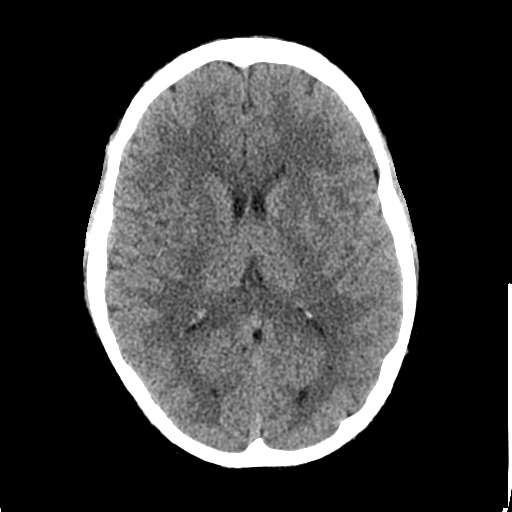
[im 15/29  bone]
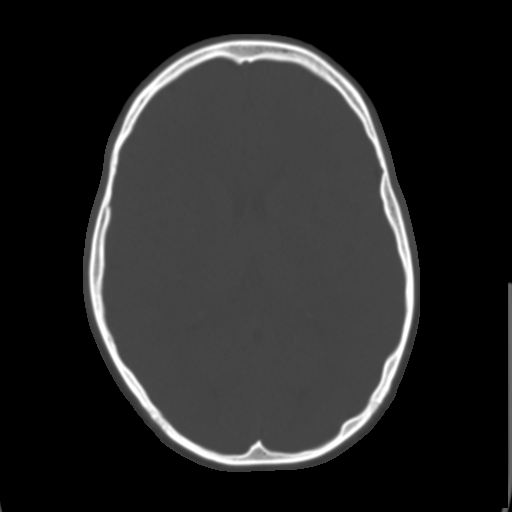
[im 17/29  brain]
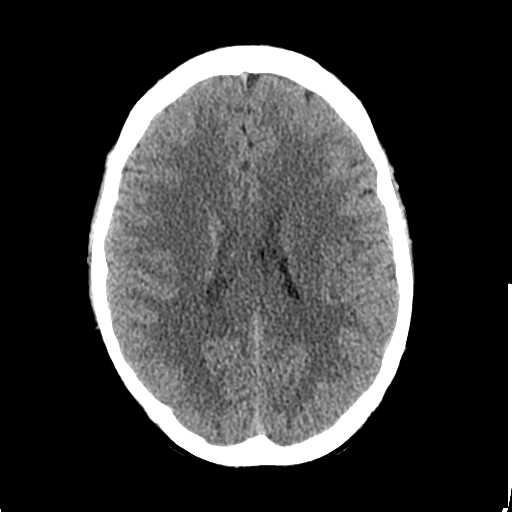
[im 20/29  brain]
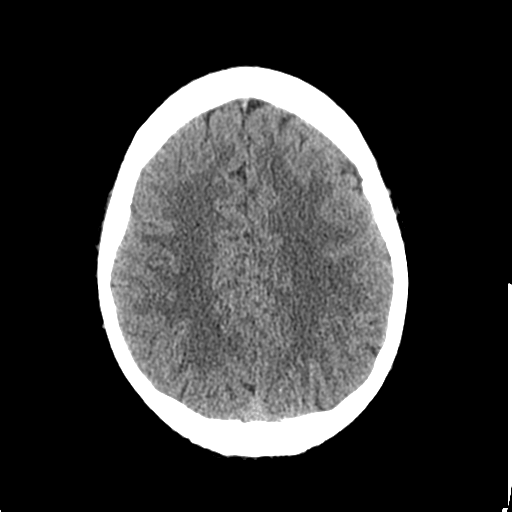
[im 23/29  brain]
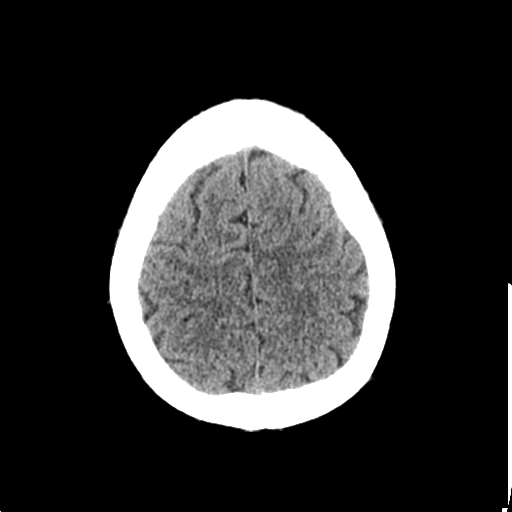
[im 26/29  brain]
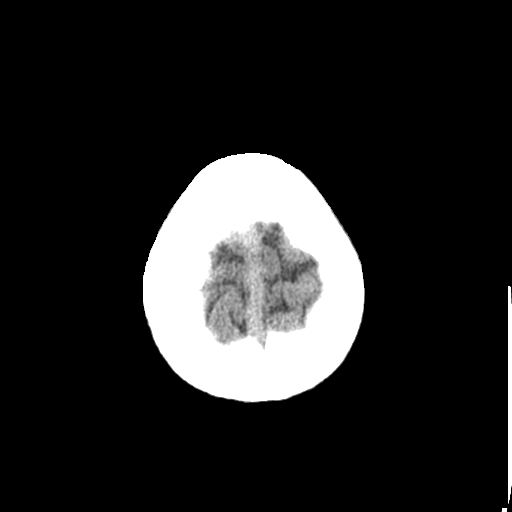
[im 26/29  bone]
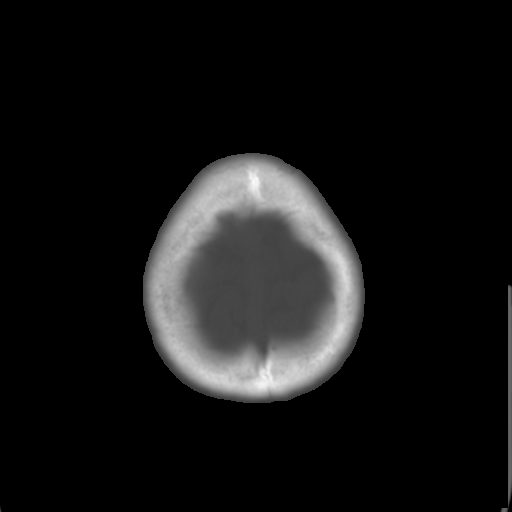

[Series 3: bone windows · axial · 0.43mm/px · z∈[-137,-29]mm · 8 of 48 slices shown]
[im 6/48  bone]
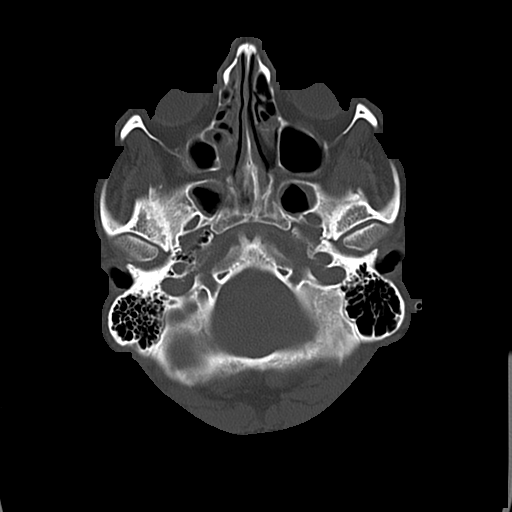
[im 11/48  bone]
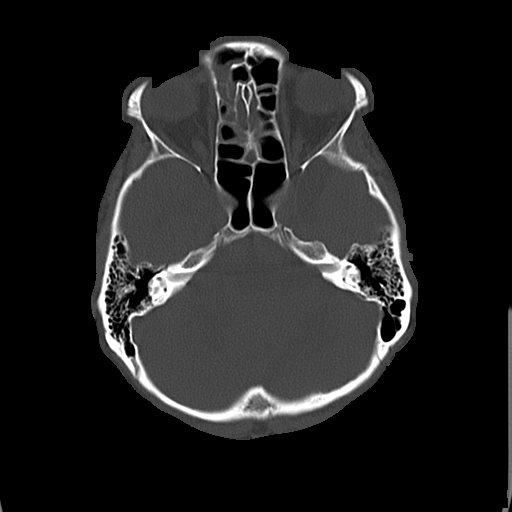
[im 16/48  bone]
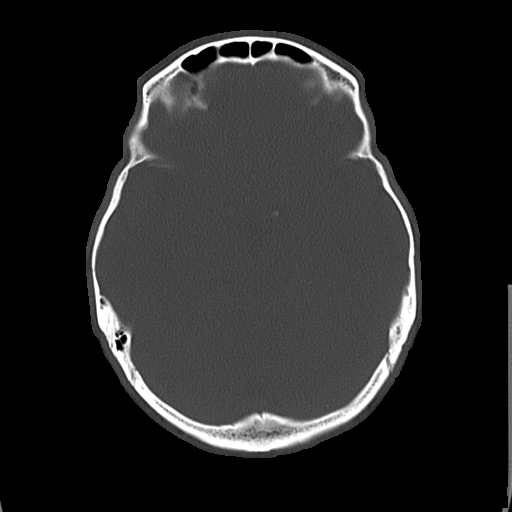
[im 21/48  bone]
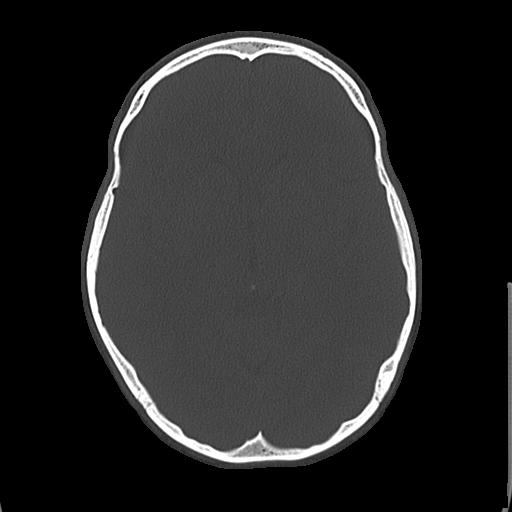
[im 27/48  bone]
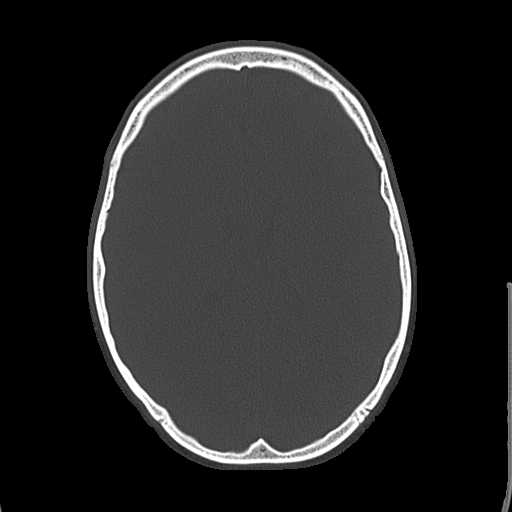
[im 32/48  bone]
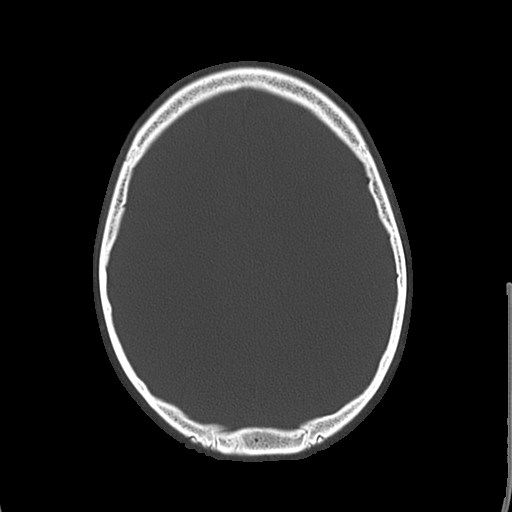
[im 37/48  bone]
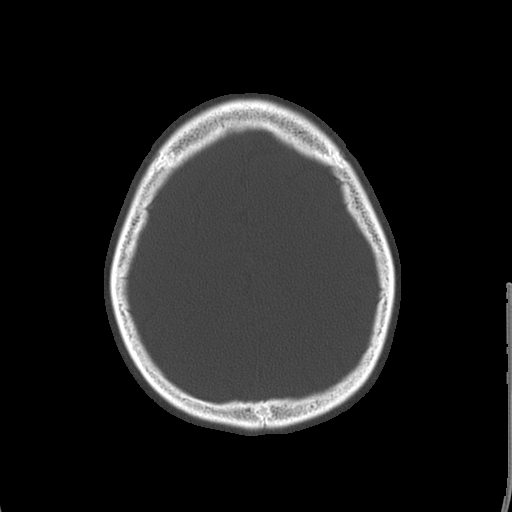
[im 42/48  bone]
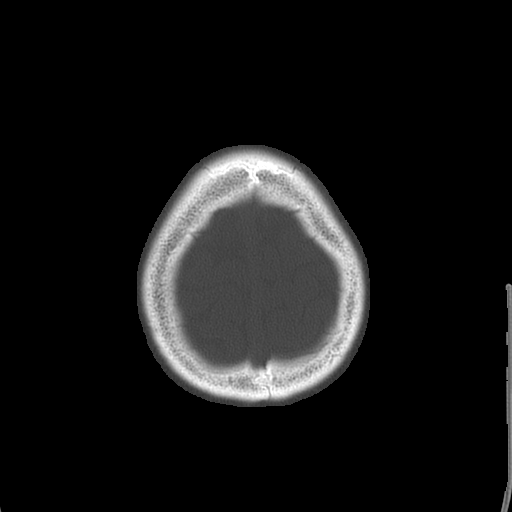

[17 of 30 positions shown; findings below may reference images not displayed]

FINDINGS: Ventricles are normal in size.  Negative for intracranial
hemorrhage.  Negative for mass or edema.  Negative for acute or
chronic infarct.

Chronic sinusitis with mucosal edema in the paranasal sinuses.  No
acute bony abnormality.
IMPRESSION: No significant intracranial abnormality.

Chronic sinusitis.

## 2014-01-26 NOTE — Telephone Encounter (Signed)
Noted  

## 2018-03-09 ENCOUNTER — Encounter (HOSPITAL_COMMUNITY): Payer: Self-pay | Admitting: Emergency Medicine

## 2018-03-09 ENCOUNTER — Emergency Department (HOSPITAL_COMMUNITY)
Admission: EM | Admit: 2018-03-09 | Discharge: 2018-03-10 | Disposition: A | Discharge status: Home / Self Care | Payer: Medicaid Other | Attending: Emergency Medicine | Admitting: Emergency Medicine

## 2018-03-09 ENCOUNTER — Emergency Department (HOSPITAL_COMMUNITY): Payer: Medicaid Other

## 2018-03-09 DIAGNOSIS — R519 Headache, unspecified: Secondary | ICD-10-CM

## 2018-03-09 DIAGNOSIS — Z79899 Other long term (current) drug therapy: Secondary | ICD-10-CM | POA: Diagnosis not present

## 2018-03-09 DIAGNOSIS — F17210 Nicotine dependence, cigarettes, uncomplicated: Secondary | ICD-10-CM | POA: Insufficient documentation

## 2018-03-09 DIAGNOSIS — I10 Essential (primary) hypertension: Secondary | ICD-10-CM | POA: Insufficient documentation

## 2018-03-09 DIAGNOSIS — R51 Headache: Secondary | ICD-10-CM | POA: Diagnosis not present

## 2018-03-09 LAB — CBC WITH DIFFERENTIAL/PLATELET
ABS IMMATURE GRANULOCYTES: 0 10*3/uL (ref 0.0–0.1)
BASOS ABS: 0.1 10*3/uL (ref 0.0–0.1)
BASOS PCT: 1 %
Eosinophils Absolute: 0.2 10*3/uL (ref 0.0–0.7)
Eosinophils Relative: 2 %
HCT: 50 % (ref 39.0–52.0)
Hemoglobin: 16.6 g/dL (ref 13.0–17.0)
IMMATURE GRANULOCYTES: 0 %
Lymphocytes Relative: 33 %
Lymphs Abs: 3.4 10*3/uL (ref 0.7–4.0)
MCH: 31.3 pg (ref 26.0–34.0)
MCHC: 33.2 g/dL (ref 30.0–36.0)
MCV: 94.2 fL (ref 78.0–100.0)
MONO ABS: 0.9 10*3/uL (ref 0.1–1.0)
Monocytes Relative: 9 %
NEUTROS ABS: 5.8 10*3/uL (ref 1.7–7.7)
NEUTROS PCT: 55 %
PLATELETS: 246 10*3/uL (ref 150–400)
RBC: 5.31 MIL/uL (ref 4.22–5.81)
RDW: 12.4 % (ref 11.5–15.5)
WBC: 10.4 10*3/uL (ref 4.0–10.5)

## 2018-03-09 LAB — BASIC METABOLIC PANEL
ANION GAP: 16 — AB (ref 5–15)
BUN: 9 mg/dL (ref 6–20)
CALCIUM: 9.7 mg/dL (ref 8.9–10.3)
CO2: 23 mmol/L (ref 22–32)
Chloride: 102 mmol/L (ref 98–111)
Creatinine, Ser: 0.94 mg/dL (ref 0.61–1.24)
GFR calc Af Amer: >60 mL/min (ref >60)
Glucose, Bld: 128 mg/dL — ABNORMAL HIGH (ref 70–99)
Potassium: 3.4 mmol/L — ABNORMAL LOW (ref 3.5–5.1)
Sodium: 141 mmol/L (ref 135–145)

## 2018-03-09 LAB — URINALYSIS, ROUTINE W REFLEX MICROSCOPIC
BILIRUBIN URINE: NEGATIVE
Glucose, UA: NEGATIVE mg/dL
Hgb urine dipstick: NEGATIVE
Ketones, ur: NEGATIVE mg/dL
Leukocytes, UA: NEGATIVE
NITRITE: NEGATIVE
PH: 5 (ref 5.0–8.0)
Protein, ur: NEGATIVE mg/dL
SPECIFIC GRAVITY, URINE: 1.023 (ref 1.005–1.030)

## 2018-03-09 LAB — RAPID URINE DRUG SCREEN, HOSP PERFORMED
AMPHETAMINES: NOT DETECTED
Barbiturates: NOT DETECTED
Benzodiazepines: POSITIVE — AB
Cocaine: NOT DETECTED
OPIATES: POSITIVE — AB
Tetrahydrocannabinol: POSITIVE — AB

## 2018-03-09 MED ORDER — KETOROLAC TROMETHAMINE 15 MG/ML IJ SOLN
15.0000 mg | Freq: Once | INTRAMUSCULAR | Status: AC
Start: 2018-03-09 — End: 2018-03-09
  Administered 2018-03-09: 15 mg via INTRAVENOUS
  Filled 2018-03-09: qty 1

## 2018-03-09 MED ORDER — SODIUM CHLORIDE 0.9 % IV BOLUS
1000.0000 mL | Freq: Once | INTRAVENOUS | Status: AC
  Administered 2018-03-09: 1000 mL via INTRAVENOUS

## 2018-03-09 MED ORDER — METOCLOPRAMIDE HCL 5 MG/ML IJ SOLN
10.0000 mg | Freq: Once | INTRAMUSCULAR | Status: AC
  Administered 2018-03-09: 10 mg via INTRAVENOUS
  Filled 2018-03-09: qty 2

## 2018-03-09 MED ORDER — DIPHENHYDRAMINE HCL 50 MG/ML IJ SOLN
25.0000 mg | Freq: Once | INTRAMUSCULAR | Status: AC
  Administered 2018-03-09: 25 mg via INTRAVENOUS
  Filled 2018-03-09: qty 1
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
## 2018-03-10 MED ORDER — BUTALBITAL-APAP-CAFFEINE 50-325-40 MG PO TABS
1.0000 | ORAL_TABLET | ORAL | 0 refills | Status: AC | PRN
Start: 2018-03-10 — End: ?
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
